# Patient Record
Sex: Male | Born: 1990
Health system: Southern US, Community
[De-identification: ages and names within clinical notes are randomized; demographics above are authoritative.]

## PROBLEM LIST (undated history)

## (undated) DIAGNOSIS — I10 Essential (primary) hypertension: Secondary | ICD-10-CM

## (undated) HISTORY — PX: NO PAST SURGERIES: SHX2092

## (undated) HISTORY — DX: Essential (primary) hypertension: I10

## (undated) HISTORY — PX: ANKLE SURGERY: SHX546

---
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
## 2012-02-21 ENCOUNTER — Ambulatory Visit (INDEPENDENT_AMBULATORY_CARE_PROVIDER_SITE_OTHER): Payer: 59 | Admitting: Internal Medicine

## 2012-02-21 ENCOUNTER — Encounter: Payer: Self-pay | Admitting: *Deleted

## 2012-02-21 ENCOUNTER — Other Ambulatory Visit: Payer: Self-pay | Admitting: Internal Medicine

## 2012-02-21 VITALS — BP 134/76 | HR 73 | Temp 98.4°F | Resp 18 | Ht 73.5 in | Wt 269.0 lb

## 2012-02-21 DIAGNOSIS — Z0000 Encounter for general adult medical examination without abnormal findings: Secondary | ICD-10-CM

## 2012-02-21 DIAGNOSIS — Z23 Encounter for immunization: Secondary | ICD-10-CM

## 2012-02-21 LAB — COMPREHENSIVE METABOLIC PANEL
ALT: 13 U/L (ref 0–53)
Albumin: 4.7 g/dL (ref 3.5–5.2)
CO2: 25 mEq/L (ref 19–32)
Glucose, Bld: 94 mg/dL (ref 70–99)
Potassium: 4.6 mEq/L (ref 3.5–5.3)
Sodium: 137 mEq/L (ref 135–145)
Total Bilirubin: 0.6 mg/dL (ref 0.3–1.2)
Total Protein: 7.6 g/dL (ref 6.0–8.3)

## 2012-02-21 LAB — POCT CBC
Granulocyte percent: 56.5 %G (ref 37–80)
HCT, POC: 43.7 % (ref 43.5–53.7)
Hemoglobin: 13.8 g/dL — AB (ref 14.1–18.1)
Lymph, poc: 2 (ref 0.6–3.4)
MCHC: 31.6 g/dL — AB (ref 31.8–35.4)
MPV: 9.4 fL (ref 0–99.8)
POC Granulocyte: 3.1 (ref 2–6.9)
POC MID %: 6.7 %M (ref 0–12)
RBC: 5.88 M/uL (ref 4.69–6.13)

## 2012-02-21 LAB — LIPID PANEL
Cholesterol: 127 mg/dL (ref 0–169)
LDL Cholesterol: 70 mg/dL (ref 0–109)
Triglycerides: 34 mg/dL (ref <150)
VLDL: 7 mg/dL (ref 0–40)

## 2012-02-21 MED ORDER — MENINGOCOCCAL A C Y&W-135 CONJ IM INJ
0.5000 mL | INJECTION | Freq: Once | INTRAMUSCULAR | Status: AC
  Administered 2012-02-21: 0.5 mL via INTRAMUSCULAR

## 2012-02-21 MED ORDER — TETANUS-DIPHTH-ACELL PERTUSSIS 5-2.5-18.5 LF-MCG/0.5 IM SUSP
0.5000 mL | Freq: Once | INTRAMUSCULAR | Status: AC
  Administered 2012-02-21: 0.5 mL via INTRAMUSCULAR

## 2012-02-21 NOTE — Progress Notes (Signed)
  Subjective:    Patient ID: Philip Irwin., male    DOB: [birth year], 21 y.o.   MRN: [id number]  HPIHere for annual exam and precollege checkup accepted at [employer] No history of medical problems  No risk behaviors Parents have no additional concerns    Review of Systems  Constitutional: Negative.   HENT: Negative.   Eyes: Negative.   Respiratory: Negative.   Cardiovascular: Negative.   Gastrointestinal: Negative.   Genitourinary: Negative.   Musculoskeletal: Negative.   Skin: Negative.   Neurological: Negative.   Hematological: Negative.   Psychiatric/Behavioral: Negative.        Objective:   Physical Exam  Constitutional: He is oriented to person, place, and time. He appears well-developed and well-nourished.       Elevated BMI  HENT:  Head: Normocephalic.  Right Ear: External ear normal.  Left Ear: External ear normal.  Nose: Nose normal.  Mouth/Throat: Oropharynx is clear and moist.  Eyes: Conjunctivae and EOM are normal. Pupils are equal, round, and reactive to light.  Neck: Normal range of motion. Neck supple.  Cardiovascular: Normal rate, regular rhythm, normal heart sounds and intact distal pulses.   Pulmonary/Chest: Effort normal and breath sounds normal.  Abdominal: Soft. Bowel sounds are normal. He exhibits no mass.  Genitourinary:       No testicular masses/testicular self-examination discussed  Musculoskeletal: Normal range of motion. He exhibits no edema.  Neurological: He is alert and oriented to person, place, and time. He has normal reflexes.  Skin: Skin is warm and dry.  Psychiatric: He has a normal mood and affect. His behavior is normal.      Results for orders placed in visit on 02/21/12  POCT CBC      Component Value Range   WBC 5.5  4.6 - 10.2 (K/uL)   Lymph, poc 2.0  0.6 - 3.4    POC LYMPH PERCENT 36.8  10 - 50 (%L)   MID (cbc) 0.4  0 - 0.9    POC MID % 6.7  0 - 12 (%M)   POC Granulocyte 3.1  2 - 6.9    Granulocyte  percent 56.5  37 - 80 (%G)   RBC 5.88  4.69 - 6.13 (M/uL)   Hemoglobin 13.8 (*) 14.1 - 18.1 (g/dL)   HCT, POC 40.9  81.1 - 53.7 (%)   MCV 74.4 (*) 80 - 97 (fL)   MCH, POC 23.5 (*) 27 - 31.2 (pg)   MCHC 31.6 (*) 31.8 - 35.4 (g/dL)   RDW, POC 91.4     Platelet Count, POC 364  142 - 424 (K/uL)   MPV 9.4  0 - 99.8 (fL)       Assessment & Plan:  Annual examination Tdap Menactra  Elevated BMI Exercise/calorie control Lipid profile LFTs

## 2012-02-24 LAB — IRON AND TIBC
%SAT: 20 % (ref 20–55)
TIBC: 353 ug/dL (ref 215–435)
UIBC: 283 ug/dL (ref 125–400)

## 2012-02-25 ENCOUNTER — Encounter: Payer: Self-pay | Admitting: Internal Medicine

## 2017-07-05 ENCOUNTER — Ambulatory Visit (INDEPENDENT_AMBULATORY_CARE_PROVIDER_SITE_OTHER): Payer: 59 | Admitting: Emergency Medicine

## 2017-07-05 ENCOUNTER — Encounter: Payer: Self-pay | Admitting: Emergency Medicine

## 2017-07-05 VITALS — BP 159/91 | HR 89 | Temp 98.6°F | Resp 18 | Ht 73.5 in | Wt 328.0 lb

## 2017-07-05 DIAGNOSIS — Z7689 Persons encountering health services in other specified circumstances: Secondary | ICD-10-CM | POA: Insufficient documentation

## 2017-07-05 DIAGNOSIS — Z0000 Encounter for general adult medical examination without abnormal findings: Secondary | ICD-10-CM | POA: Diagnosis not present

## 2017-07-05 NOTE — Progress Notes (Signed)
Philip Irwin. 26 y.o.   Chief Complaint  Patient presents with  . Annual Exam    HISTORY OF PRESENT ILLNESS: This is a 26 y.o. male here for annual exam; has no complaints or medical concerns.  HPI   Prior to Admission medications   Not on File    No Known Allergies  There are no active problems to display for this patient.   No past medical history on file.  No past surgical history on file.  Social History   Social History  . Marital status: Single    Spouse name: N/A  . Number of children: N/A  . Years of education: N/A   Occupational History  . Not on file.   Social History Main Topics  . Smoking status: Never Smoker  . Smokeless tobacco: Never Used  . Alcohol use Yes     Comment: 8-10   . Drug use: No  . Sexual activity: Not on file   Other Topics Concern  . Not on file   Social History Narrative  . No narrative on file    No family history on file.   Review of Systems  Constitutional: Negative.  Negative for chills, fever and malaise/fatigue.  HENT: Negative.  Negative for congestion, hearing loss, nosebleeds and sore throat.   Eyes: Negative.  Negative for blurred vision and double vision.  Respiratory: Negative for cough, hemoptysis, shortness of breath and wheezing.   Cardiovascular: Negative.  Negative for chest pain and palpitations.  Gastrointestinal: Negative.  Negative for abdominal pain, diarrhea, nausea and vomiting.  Genitourinary: Negative.  Negative for dysuria, frequency and hematuria.  Musculoskeletal: Negative.  Negative for back pain, joint pain, myalgias and neck pain.  Skin: Negative.  Negative for rash.  Neurological: Negative.  Negative for dizziness, sensory change, focal weakness and headaches.  Endo/Heme/Allergies: Negative.   All other systems reviewed and are negative.   Vitals:   07/05/17 0856  BP: (!) 159/91  Pulse: 89  Resp: 18  Temp: 98.6 F (37 C)  SpO2: 99%    Physical Exam    Constitutional: He is oriented to person, place, and time. He appears well-developed and well-nourished.  HENT:  Head: Normocephalic and atraumatic.  Right Ear: External ear normal.  Left Ear: External ear normal.  Nose: Nose normal.  Mouth/Throat: Oropharynx is clear and moist.  Eyes: Pupils are equal, round, and reactive to light. Conjunctivae and EOM are normal.  Neck: Normal range of motion. Neck supple. No JVD present.  Cardiovascular: Normal rate, regular rhythm, normal heart sounds and intact distal pulses.   Pulmonary/Chest: Effort normal and breath sounds normal.  Abdominal: Soft. Bowel sounds are normal. He exhibits no distension and no mass. There is no tenderness.  Musculoskeletal: Normal range of motion. He exhibits no edema or tenderness.  Lymphadenopathy:    He has no cervical adenopathy.  Neurological: He is alert and oriented to person, place, and time. No cranial nerve deficit or sensory deficit. He exhibits normal muscle tone. Coordination normal.  Skin: Skin is warm and dry. Capillary refill takes less than 2 seconds. No rash noted.  Psychiatric: He has a normal mood and affect. His behavior is normal.  Vitals reviewed.    ASSESSMENT & PLAN: Izaiyah was seen today for annual exam.  Diagnoses and all orders for this visit:  Routine general medical examination at a health care facility -     CBC with Differential -     Comprehensive metabolic panel -  Hemoglobin A1c -     Lipid panel -     TSH -     HIV antibody  Other orders -     Cancel: PSA -     Cancel: HIV antibody -     Cancel: RPR -     Cancel: Comprehensive metabolic panel -     Cancel: Lipid panel -     Cancel: CBC -     Cancel: POCT urinalysis dipstick    Patient Instructions       IF you received an x-ray today, you will receive an invoice from [facility]. Please contact [facility] at [phone] with questions or concerns regarding your invoice.   IF you  received labwork today, you will receive an invoice from [facility]. Please contact [facility] at [phone] with questions or concerns regarding your invoice.   Our billing staff will not be able to assist you with questions regarding bills from these companies.  You will be contacted with the lab results as soon as they are available. The fastest way to get your results is to activate your My Chart account. Instructions are located on the last page of this paperwork. If you have not heard from [place] regarding the results in 2 weeks, please contact this office.         Health Maintenance, Male A healthy lifestyle and preventive care is important for your health and wellness. Ask your health care provider about what schedule of regular examinations is right for you. What should I know about weight and diet? Eat a Healthy Diet  Eat plenty of vegetables, fruits, whole grains, low-fat dairy products, and lean protein.  Do not eat a lot of foods high in solid fats, added sugars, or salt.  Maintain a Healthy Weight Regular exercise can help you achieve or maintain a healthy weight. You should:  Do at least 150 minutes of exercise each week. The exercise should increase your heart rate and make you sweat (moderate-intensity exercise).  Do strength-training exercises at least twice a week.  Watch Your Levels of Cholesterol and Blood Lipids  Have your blood tested for lipids and cholesterol every 5 years starting at 24 years of age. If you are at high risk for heart disease, you should start having your blood tested when you are 24 years old. You may need to have your cholesterol levels checked more often if: ? Your lipid or cholesterol levels are high. ? You are older than 24 years of age. ? You are at high risk for heart disease.  What should I know about cancer screening? Many types of cancers can be detected early and may often be prevented. Lung Cancer  You should be screened every  year for lung cancer if: ? You are a current smoker who has smoked for at least 30 years. ? You are a former smoker who has quit within the past 15 years.  Talk to your health care provider about your screening options, when you should start screening, and how often you should be screened.  Colorectal Cancer  Routine colorectal cancer screening usually begins at 24 years of age and should be repeated every 5-10 years until you are 24 years old. You may need to be screened more often if early forms of precancerous polyps or small growths are found. Your health care provider may recommend screening at an earlier age if you have risk factors for colon cancer.  Your health care provider may recommend using home test  kits to check for hidden blood in the stool.  A small camera at the end of a tube can be used to examine your colon (sigmoidoscopy or colonoscopy). This checks for the earliest forms of colorectal cancer.  Prostate and Testicular Cancer  Depending on your age and overall health, your health care provider may do certain tests to screen for prostate and testicular cancer.  Talk to your health care provider about any symptoms or concerns you have about testicular or prostate cancer.  Skin Cancer  Check your skin from head to toe regularly.  Tell your health care provider about any new moles or changes in moles, especially if: ? There is a change in a mole's size, shape, or color. ? You have a mole that is larger than a pencil eraser.  Always use sunscreen. Apply sunscreen liberally and repeat throughout the day.  Protect yourself by wearing long sleeves, pants, a wide-brimmed hat, and sunglasses when outside.  What should I know about heart disease, diabetes, and high blood pressure?  If you are 76-25 years of age, have your blood pressure checked every 3-5 years. If you are 73 years of age or older, have your blood pressure checked every year. You should have your blood  pressure measured twice-once when you are at a hospital or clinic, and once when you are not at a hospital or clinic. Record the average of the two measurements. To check your blood pressure when you are not at a hospital or clinic, you can use: ? An automated blood pressure machine at a pharmacy. ? A home blood pressure monitor.  Talk to your health care provider about your target blood pressure.  If you are between 29-12 years old, ask your health care provider if you should take aspirin to prevent heart disease.  Have regular diabetes screenings by checking your fasting blood sugar level. ? If you are at a normal weight and have a low risk for diabetes, have this test once every three years after the age of 39. ? If you are overweight and have a high risk for diabetes, consider being tested at a younger age or more often.  A one-time screening for abdominal aortic aneurysm (AAA) by ultrasound is recommended for men aged 65-75 years who are current or former smokers. What should I know about preventing infection? Hepatitis B If you have a higher risk for hepatitis B, you should be screened for this virus. Talk with your health care provider to find out if you are at risk for hepatitis B infection. Hepatitis C Blood testing is recommended for:  Everyone born from [birth year] through 1963.  Anyone with known risk factors for hepatitis C.  Sexually Transmitted Diseases (STDs)  You should be screened each year for STDs including gonorrhea and chlamydia if: ? You are sexually active and are younger than 24 years of age. ? You are older than 24 years of age and your health care provider tells you that you are at risk for this type of infection. ? Your sexual activity has changed since you were last screened and you are at an increased risk for chlamydia or gonorrhea. Ask your health care provider if you are at risk.  Talk with your health care provider about whether you are at high risk of being  infected with HIV. Your health care provider may recommend a prescription medicine to help prevent HIV infection.  What else can I do?  Schedule regular health, dental, and eye exams.  Stay current with your vaccines (immunizations).  Do not use any tobacco products, such as cigarettes, chewing tobacco, and e-cigarettes. If you need help quitting, ask your health care provider.  Limit alcohol intake to no more than 2 drinks per day. One drink equals 12 ounces of beer, 5 ounces of wine, or 1 ounces of hard liquor.  Do not use street drugs.  Do not share needles.  Ask your health care provider for help if you need support or information about quitting drugs.  Tell your health care provider if you often feel depressed.  Tell your health care provider if you have ever been abused or do not feel safe at home. This information is not intended to replace advice given to you by your health care provider. Make sure you discuss any questions you have with your health care provider. Document Released: 04/08/2008 Document Revised: 06/09/2016 Document Reviewed: 07/15/2015 Elsevier Interactive Patient Education  2018 [employer].   Fat and Cholesterol Restricted Diet Getting too much fat and cholesterol in your diet may cause health problems. Following this diet helps keep your fat and cholesterol at normal levels. This can keep you from getting sick. What types of fat should I choose?  Choose monosaturated and polyunsaturated fats. These are found in foods such as olive oil, canola oil, flaxseeds, walnuts, almonds, and seeds.  Eat more omega-3 fats. Good choices include salmon, mackerel, sardines, tuna, flaxseed oil, and ground flaxseeds.  Limit saturated fats. These are in animal products such as meats, butter, and cream. They can also be in plant products such as palm oil, palm kernel oil, and coconut oil.  Avoid foods with partially hydrogenated oils in them. These contain trans fats.  Examples of foods that have trans fats are stick margarine, some tub margarines, cookies, crackers, and other baked goods. What general guidelines do I need to follow?  Check food labels. Look for the words "trans fat" and "saturated fat."  When preparing a meal: ? Fill half of your plate with vegetables and green salads. ? Fill one fourth of your plate with whole grains. Look for the word "whole" as the first word in the ingredient list. ? Fill one fourth of your plate with lean protein foods.  Eat more foods that have fiber, like apples, carrots, beans, peas, and barley.  Eat more home-cooked foods. Eat less at restaurants and buffets.  Limit or avoid alcohol.  Limit foods high in starch and sugar.  Limit fried foods.  Cook foods without frying them. Baking, boiling, grilling, and broiling are all great options.  Lose weight if you are overweight. Losing even a small amount of weight can help your overall health. It can also help prevent diseases such as diabetes and heart disease. What foods can I eat? Grains Whole grains, such as whole wheat or whole grain breads, crackers, cereals, and pasta. Unsweetened oatmeal, bulgur, barley, quinoa, or brown rice. Corn or whole wheat flour tortillas. Vegetables Fresh or frozen vegetables (raw, steamed, roasted, or grilled). Green salads. Fruits All fresh, canned (in natural juice), or frozen fruits. Meat and Other Protein Products Ground beef (85% or leaner), grass-fed beef, or beef trimmed of fat. Skinless chicken or [place]. Ground chicken or [place]. Pork trimmed of fat. All fish and seafood. Eggs. Dried beans, peas, or lentils. Unsalted nuts or seeds. Unsalted canned or dry beans. Dairy Low-fat dairy products, such as skim or 1% milk, 2% or reduced-fat cheeses, low-fat ricotta or cottage cheese, or plain low-fat yogurt. Fats  and Oils Tub margarines without trans fats. Light or reduced-fat mayonnaise and salad dressings. Avocado. Olive,  canola, sesame, or safflower oils. Natural peanut or almond butter (choose ones without added sugar and oil). The items listed above may not be a complete list of recommended foods or beverages. Contact your dietitian for more options. What foods are not recommended? Grains White bread. White pasta. White rice. Cornbread. Bagels, pastries, and croissants. Crackers that contain trans fat. Vegetables White potatoes. Corn. Creamed or fried vegetables. Vegetables in a cheese sauce. Fruits Dried fruits. Canned fruit in light or heavy syrup. Fruit juice. Meat and Other Protein Products Fatty cuts of meat. Ribs, chicken wings, bacon, sausage, bologna, salami, chitterlings, fatback, hot dogs, bratwurst, and packaged luncheon meats. Liver and organ meats. Dairy Whole or 2% milk, cream, half-and-half, and cream cheese. Whole milk cheeses. Whole-fat or sweetened yogurt. Full-fat cheeses. Nondairy creamers and whipped toppings. Processed cheese, cheese spreads, or cheese curds. Sweets and Desserts Corn syrup, sugars, honey, and molasses. Candy. Jam and jelly. Syrup. Sweetened cereals. Cookies, pies, cakes, donuts, muffins, and ice cream. Fats and Oils Butter, stick margarine, lard, shortening, ghee, or bacon fat. Coconut, palm kernel, or palm oils. Beverages Alcohol. Sweetened drinks (such as sodas, lemonade, and fruit drinks or punches). The items listed above may not be a complete list of foods and beverages to avoid. Contact your dietitian for more information. This information is not intended to replace advice given to you by your health care provider. Make sure you discuss any questions you have with your health care provider. Document Released: 04/11/2012 Document Revised: 06/17/2016 Document Reviewed: 01/10/2014 Elsevier Interactive Patient Education  2018 [employer]  American Heart Association (AHA) Exercise Recommendation  Being physically active is important to prevent heart disease and  stroke, the nation's No. 1and No. 5killers. To improve overall cardiovascular health, we suggest at least 150 minutes per week of moderate exercise or 75 minutes per week of vigorous exercise (or a combination of moderate and vigorous activity). Thirty minutes a day, five times a week is an easy goal to remember. You will also experience benefits even if you divide your time into two or three segments of 10 to 15 minutes per day.  For people who would benefit from lowering their blood pressure or cholesterol, we recommend 40 minutes of aerobic exercise of moderate to vigorous intensity three to four times a week to lower the risk for heart attack and stroke.  Physical activity is anything that makes you move your body and burn calories.  This includes things like climbing stairs or playing sports. Aerobic exercises benefit your heart, and include walking, jogging, swimming or biking. Strength and stretching exercises are best for overall stamina and flexibility.  The simplest, positive change you can make to effectively improve your heart health is to start walking. It's enjoyable, free, easy, social and great exercise. A walking program is flexible and boasts high success rates because people can stick with it. It's easy for walking to become a regular and satisfying part of life.   For Overall Cardiovascular Health:  At least 30 minutes of moderate-intensity aerobic activity at least 5 days per week for a total of 150  OR   At least 25 minutes of vigorous aerobic activity at least 3 days per week for a total of 75 minutes; or a combination of moderate- and vigorous-intensity aerobic activity  AND   Moderate- to high-intensity muscle-strengthening activity at least 2 days per week for additional health benefits.  For Lowering Blood Pressure and Cholesterol  An average 40 minutes of moderate- to vigorous-intensity aerobic activity 3 or 4 times per week  What if I can't make it to the time  goal? Something is always better than nothing! And everyone has to start somewhere. Even if you've been sedentary for years, today is the day you can begin to make healthy changes in your life. If you don't think you'll make it for 30 or 40 minutes, set a reachable goal for today. You can work up toward your overall goal by increasing your time as you get stronger. Don't let all-or-nothing thinking rob you of doing what you can every day.  Source:[email or web address], MD [facility]

## 2017-07-05 NOTE — Patient Instructions (Addendum)
IF you received an x-ray today, you will receive an invoice from [facility]. Please contact [facility] at [phone] with questions or concerns regarding your invoice.   IF you received labwork today, you will receive an invoice from [facility]. Please contact [facility] at [phone] with questions or concerns regarding your invoice.   Our billing staff will not be able to assist you with questions regarding bills from these companies.  You will be contacted with the lab results as soon as they are available. The fastest way to get your results is to activate your My Chart account. Instructions are located on the last page of this paperwork. If you have not heard from [place] regarding the results in 2 weeks, please contact this office.         Health Maintenance, Male A healthy lifestyle and preventive care is important for your health and wellness. Ask your health care provider about what schedule of regular examinations is right for you. What should I know about weight and diet? Eat a Healthy Diet  Eat plenty of vegetables, fruits, whole grains, low-fat dairy products, and lean protein.  Do not eat a lot of foods high in solid fats, added sugars, or salt.  Maintain a Healthy Weight Regular exercise can help you achieve or maintain a healthy weight. You should:  Do at least 150 minutes of exercise each week. The exercise should increase your heart rate and make you sweat (moderate-intensity exercise).  Do strength-training exercises at least twice a week.  Watch Your Levels of Cholesterol and Blood Lipids  Have your blood tested for lipids and cholesterol every 5 years starting at 24 years of age. If you are at high risk for heart disease, you should start having your blood tested when you are 24 years old. You may need to have your cholesterol levels checked more often if: ? Your lipid or cholesterol levels are high. ? You are older than 24 years of  age. ? You are at high risk for heart disease.  What should I know about cancer screening? Many types of cancers can be detected early and may often be prevented. Lung Cancer  You should be screened every year for lung cancer if: ? You are a current smoker who has smoked for at least 30 years. ? You are a former smoker who has quit within the past 15 years.  Talk to your health care provider about your screening options, when you should start screening, and how often you should be screened.  Colorectal Cancer  Routine colorectal cancer screening usually begins at 24 years of age and should be repeated every 5-10 years until you are 24 years old. You may need to be screened more often if early forms of precancerous polyps or small growths are found. Your health care provider may recommend screening at an earlier age if you have risk factors for colon cancer.  Your health care provider may recommend using home test kits to check for hidden blood in the stool.  A small camera at the end of a tube can be used to examine your colon (sigmoidoscopy or colonoscopy). This checks for the earliest forms of colorectal cancer.  Prostate and Testicular Cancer  Depending on your age and overall health, your health care provider may do certain tests to screen for prostate and testicular cancer.  Talk to your health care provider about any symptoms or concerns you have about testicular or prostate cancer.  Skin Cancer  from head to toe regularly.  Tell your health care provider about any new moles or changes in moles, especially if: ? There is a change in a mole's size, shape, or color. ? You have a mole that is larger than a pencil eraser.  Always use sunscreen. Apply sunscreen liberally and repeat throughout the day.  Protect yourself by wearing long sleeves, pants, a wide-brimmed hat, and sunglasses when outside.  What should I know about heart disease, diabetes, and high blood  pressure?  If you are 18-39 years of age, have your blood pressure checked every 3-5 years. If you are 40 years of age or older, have your blood pressure checked every year. You should have your blood pressure measured twice-once when you are at a hospital or clinic, and once when you are not at a hospital or clinic. Record the average of the two measurements. To check your blood pressure when you are not at a hospital or clinic, you can use: ? An automated blood pressure machine at a pharmacy. ? A home blood pressure monitor.  Talk to your health care provider about your target blood pressure.  If you are between 45-79 years old, ask your health care provider if you should take aspirin to prevent heart disease.  Have regular diabetes screenings by checking your fasting blood sugar level. ? If you are at a normal weight and have a low risk for diabetes, have this test once every three years after the age of 45. ? If you are overweight and have a high risk for diabetes, consider being tested at a younger age or more often.  A one-time screening for abdominal aortic aneurysm (AAA) by ultrasound is recommended for men aged 65-75 years who are current or former smokers. What should I know about preventing infection? Hepatitis B If you have a higher risk for hepatitis B, you should be screened for this virus. Talk with your health care provider to find out if you are at risk for hepatitis B infection. Hepatitis C Blood testing is recommended for:  Everyone born from 1945 through 1965.  Anyone with known risk factors for hepatitis C.  Sexually Transmitted Diseases (STDs)  You should be screened each year for STDs including gonorrhea and chlamydia if: ? You are sexually active and are younger than 24 years of age. ? You are older than 24 years of age and your health care provider tells you that you are at risk for this type of infection. ? Your sexual activity has changed since you were last  screened and you are at an increased risk for chlamydia or gonorrhea. Ask your health care provider if you are at risk.  Talk with your health care provider about whether you are at high risk of being infected with HIV. Your health care provider may recommend a prescription medicine to help prevent HIV infection.  What else can I do?  Schedule regular health, dental, and eye exams.  Stay current with your vaccines (immunizations).  Do not use any tobacco products, such as cigarettes, chewing tobacco, and e-cigarettes. If you need help quitting, ask your health care provider.  Limit alcohol intake to no more than 2 drinks per day. One drink equals 12 ounces of beer, 5 ounces of wine, or 1 ounces of hard liquor.  Do not use street drugs.  Do not share needles.  Ask your health care provider for help if you need support or information about quitting drugs.  Tell your health care   your health care provider if you often feel depressed.  Tell your health care provider if you have ever been abused or do not feel safe at home. This information is not intended to replace advice given to you by your health care provider. Make sure you discuss any questions you have with your health care provider. Document Released: 04/08/2008 Document Revised: 06/09/2016 Document Reviewed: 07/15/2015 Elsevier Interactive Patient Education  2018 [employer].   Fat and Cholesterol Restricted Diet Getting too much fat and cholesterol in your diet may cause health problems. Following this diet helps keep your fat and cholesterol at normal levels. This can keep you from getting sick. What types of fat should I choose?  Choose monosaturated and polyunsaturated fats. These are found in foods such as olive oil, canola oil, flaxseeds, walnuts, almonds, and seeds.  Eat more omega-3 fats. Good choices include salmon, mackerel, sardines, tuna, flaxseed oil, and ground flaxseeds.  Limit saturated fats. These are in animal  products such as meats, butter, and cream. They can also be in plant products such as palm oil, palm kernel oil, and coconut oil.  Avoid foods with partially hydrogenated oils in them. These contain trans fats. Examples of foods that have trans fats are stick margarine, some tub margarines, cookies, crackers, and other baked goods. What general guidelines do I need to follow?  Check food labels. Look for the words "trans fat" and "saturated fat."  When preparing a meal: ? Fill half of your plate with vegetables and green salads. ? Fill one fourth of your plate with whole grains. Look for the word "whole" as the first word in the ingredient list. ? Fill one fourth of your plate with lean protein foods.  Eat more foods that have fiber, like apples, carrots, beans, peas, and barley.  Eat more home-cooked foods. Eat less at restaurants and buffets.  Limit or avoid alcohol.  Limit foods high in starch and sugar.  Limit fried foods.  Cook foods without frying them. Baking, boiling, grilling, and broiling are all great options.  Lose weight if you are overweight. Losing even a small amount of weight can help your overall health. It can also help prevent diseases such as diabetes and heart disease. What foods can I eat? Grains Whole grains, such as whole wheat or whole grain breads, crackers, cereals, and pasta. Unsweetened oatmeal, bulgur, barley, quinoa, or brown rice. Corn or whole wheat flour tortillas. Vegetables Fresh or frozen vegetables (raw, steamed, roasted, or grilled). Green salads. Fruits All fresh, canned (in natural juice), or frozen fruits. Meat and Other Protein Products Ground beef (85% or leaner), grass-fed beef, or beef trimmed of fat. Skinless chicken or [place]. Ground chicken or [place]. Pork trimmed of fat. All fish and seafood. Eggs. Dried beans, peas, or lentils. Unsalted nuts or seeds. Unsalted canned or dry beans. Dairy Low-fat dairy products, such as skim or 1%  milk, 2% or reduced-fat cheeses, low-fat ricotta or cottage cheese, or plain low-fat yogurt. Fats and Oils Tub margarines without trans fats. Light or reduced-fat mayonnaise and salad dressings. Avocado. Olive, canola, sesame, or safflower oils. Natural peanut or almond butter (choose ones without added sugar and oil). The items listed above may not be a complete list of recommended foods or beverages. Contact your dietitian for more options. What foods are not recommended? Grains White bread. White pasta. White rice. Cornbread. Bagels, pastries, and croissants. Crackers that contain trans fat. Vegetables White potatoes. Corn. Creamed or fried vegetables. Vegetables in a cheese  sauce. Fruits Dried fruits. Canned fruit in light or heavy syrup. Fruit juice. Meat and Other Protein Products Fatty cuts of meat. Ribs, chicken wings, bacon, sausage, bologna, salami, chitterlings, fatback, hot dogs, bratwurst, and packaged luncheon meats. Liver and organ meats. Dairy Whole or 2% milk, cream, half-and-half, and cream cheese. Whole milk cheeses. Whole-fat or sweetened yogurt. Full-fat cheeses. Nondairy creamers and whipped toppings. Processed cheese, cheese spreads, or cheese curds. Sweets and Desserts Corn syrup, sugars, honey, and molasses. Candy. Jam and jelly. Syrup. Sweetened cereals. Cookies, pies, cakes, donuts, muffins, and ice cream. Fats and Oils Butter, stick margarine, lard, shortening, ghee, or bacon fat. Coconut, palm kernel, or palm oils. Beverages Alcohol. Sweetened drinks (such as sodas, lemonade, and fruit drinks or punches). The items listed above may not be a complete list of foods and beverages to avoid. Contact your dietitian for more information. This information is not intended to replace advice given to you by your health care provider. Make sure you discuss any questions you have with your health care provider. Document Released: 04/11/2012 Document Revised: 06/17/2016 Document  Reviewed: 01/10/2014 Elsevier Interactive Patient Education  2018 [employer]  American Heart Association (AHA) Exercise Recommendation  Being physically active is important to prevent heart disease and stroke, the nation's No. 1and No. 5killers. To improve overall cardiovascular health, we suggest at least 150 minutes per week of moderate exercise or 75 minutes per week of vigorous exercise (or a combination of moderate and vigorous activity). Thirty minutes a day, five times a week is an easy goal to remember. You will also experience benefits even if you divide your time into two or three segments of 10 to 15 minutes per day.  For people who would benefit from lowering their blood pressure or cholesterol, we recommend 40 minutes of aerobic exercise of moderate to vigorous intensity three to four times a week to lower the risk for heart attack and stroke.  Physical activity is anything that makes you move your body and burn calories.  This includes things like climbing stairs or playing sports. Aerobic exercises benefit your heart, and include walking, jogging, swimming or biking. Strength and stretching exercises are best for overall stamina and flexibility.  The simplest, positive change you can make to effectively improve your heart health is to start walking. It's enjoyable, free, easy, social and great exercise. A walking program is flexible and boasts high success rates because people can stick with it. It's easy for walking to become a regular and satisfying part of life.   For Overall Cardiovascular Health:  At least 30 minutes of moderate-intensity aerobic activity at least 5 days per week for a total of 150  OR   At least 25 minutes of vigorous aerobic activity at least 3 days per week for a total of 75 minutes; or a combination of moderate- and vigorous-intensity aerobic activity  AND   Moderate- to high-intensity muscle-strengthening activity at least 2 days per week for  additional health benefits.  For Lowering Blood Pressure and Cholesterol  An average 40 minutes of moderate- to vigorous-intensity aerobic activity 3 or 4 times per week  What if I can't make it to the time goal? Something is always better than nothing! And everyone has to start somewhere. Even if you've been sedentary for years, today is the day you can begin to make healthy changes in your life. If you don't think you'll make it for 30 or 40 minutes, set a reachable goal for today. You can  work up toward your overall goal by increasing your time as you get stronger. Don't let all-or-nothing thinking rob you of doing what you can every day.  Source:http://www.heart.org

## 2017-07-06 ENCOUNTER — Encounter: Payer: Self-pay | Admitting: Radiology

## 2017-07-06 LAB — COMPREHENSIVE METABOLIC PANEL
ALK PHOS: 51 IU/L (ref 39–117)
ALT: 15 IU/L (ref 0–44)
AST: 15 IU/L (ref 0–40)
Albumin/Globulin Ratio: 1.5 (ref 1.2–2.2)
Albumin: 4.5 g/dL (ref 3.5–5.5)
BILIRUBIN TOTAL: 0.5 mg/dL (ref 0.0–1.2)
BUN/Creatinine Ratio: 10 (ref 9–20)
BUN: 12 mg/dL (ref 6–20)
CHLORIDE: 100 mmol/L (ref 96–106)
CO2: 22 mmol/L (ref 20–29)
Calcium: 9.1 mg/dL (ref 8.7–10.2)
Creatinine, Ser: 1.16 mg/dL (ref 0.76–1.27)
GFR calc Af Amer: 102 mL/min/{1.73_m2} (ref >59)
GFR calc non Af Amer: 88 mL/min/{1.73_m2} (ref >59)
Globulin, Total: 3 g/dL (ref 1.5–4.5)
Glucose: 99 mg/dL (ref 65–99)
Potassium: 4.7 mmol/L (ref 3.5–5.2)
Sodium: 137 mmol/L (ref 134–144)
Total Protein: 7.5 g/dL (ref 6.0–8.5)

## 2017-07-06 LAB — LIPID PANEL
CHOLESTEROL TOTAL: 115 mg/dL (ref 100–199)
Chol/HDL Ratio: 2.4 ratio (ref 0.0–5.0)
HDL: 47 mg/dL (ref >39)
LDL Calculated: 59 mg/dL (ref 0–99)
TRIGLYCERIDES: 46 mg/dL (ref 0–149)
VLDL Cholesterol Cal: 9 mg/dL (ref 5–40)

## 2017-07-06 LAB — CBC WITH DIFFERENTIAL/PLATELET
BASOS ABS: 0 10*3/uL (ref 0.0–0.2)
Basos: 0 %
EOS (ABSOLUTE): 0.3 10*3/uL (ref 0.0–0.4)
Eos: 4 %
Hematocrit: 42.4 % (ref 37.5–51.0)
Hemoglobin: 13.4 g/dL (ref 13.0–17.7)
IMMATURE GRANS (ABS): 0 10*3/uL (ref 0.0–0.1)
Immature Granulocytes: 0 %
LYMPHS ABS: 1.7 10*3/uL (ref 0.7–3.1)
LYMPHS: 28 %
MCH: 23.1 pg — AB (ref 26.6–33.0)
MCHC: 31.6 g/dL (ref 31.5–35.7)
MCV: 73 fL — ABNORMAL LOW (ref 79–97)
Monocytes Absolute: 0.6 10*3/uL (ref 0.1–0.9)
Monocytes: 10 %
NEUTROS ABS: 3.5 10*3/uL (ref 1.4–7.0)
Neutrophils: 58 %
PLATELETS: 375 10*3/uL (ref 150–379)
RBC: 5.8 x10E6/uL (ref 4.14–5.80)
RDW: 15.4 % (ref 12.3–15.4)
WBC: 6 10*3/uL (ref 3.4–10.8)

## 2017-07-06 LAB — HEMOGLOBIN A1C
Est. average glucose Bld gHb Est-mCnc: 128 mg/dL
HEMOGLOBIN A1C: 6.1 % — AB (ref 4.8–5.6)

## 2017-07-06 LAB — TSH: TSH: 1.57 u[IU]/mL (ref 0.450–4.500)

## 2017-07-06 LAB — HIV ANTIBODY (ROUTINE TESTING W REFLEX): HIV SCREEN 4TH GENERATION: NONREACTIVE

## 2019-01-10 ENCOUNTER — Ambulatory Visit (HOSPITAL_COMMUNITY)
Admission: EM | Admit: 2019-01-10 | Discharge: 2019-01-10 | Disposition: A | Discharge status: Home / Self Care | Payer: 59 | Attending: Family Medicine | Admitting: Family Medicine

## 2019-01-10 ENCOUNTER — Encounter (HOSPITAL_COMMUNITY): Payer: Self-pay | Admitting: Emergency Medicine

## 2019-01-10 ENCOUNTER — Other Ambulatory Visit: Payer: Self-pay

## 2019-01-10 DIAGNOSIS — B349 Viral infection, unspecified: Secondary | ICD-10-CM

## 2019-01-10 MED ORDER — FLUTICASONE PROPIONATE 50 MCG/ACT NA SUSP: 2.0000 | Freq: Every day | NASAL | 0 refills | Status: DC

## 2019-01-10 MED ORDER — BENZONATATE 100 MG PO CAPS: 100.0000 mg | ORAL_CAPSULE | Freq: Three times a day (TID) | ORAL | 0 refills | Status: DC

## 2019-01-10 MED ORDER — IPRATROPIUM BROMIDE 0.06 % NA SOLN: 2.0000 | Freq: Four times a day (QID) | NASAL | 0 refills | Status: DC

## 2019-01-10 NOTE — Discharge Instructions (Signed)
Tessalon for cough. Start flonase, atrovent nasal spray for nasal congestion/drainage. You can use over the counter nasal saline rinse such as neti pot for nasal congestion. Keep hydrated, your urine should be clear to pale yellow in color. Tylenol/motrin for fever and pain. Monitor for any worsening of symptoms, chest pain, shortness of breath, wheezing, swelling of the throat, follow up for reevaluation.  ° °For sore throat/cough try using a honey-based tea. Use 3 teaspoons of honey with juice squeezed from half lemon. Place shaved pieces of ginger into 1/2-1 cup of water and warm over stove top. Then mix the ingredients and repeat every 4 hours as needed. °

## 2019-01-10 NOTE — ED Provider Notes (Signed)
[facility]    CSN: [id number] Arrival date & time: 01/10/19  1455     History   Chief Complaint No chief complaint on file.   HPI Ketih Cordill. is a 28 y.o. male.   28 year old male comes in for 4-5 days of URI symptoms. Has had cough, nasal congestion, rhinorrhea. States now with left chest and shoulder pain with sneezing and cough. Denies shortness of breath, wheezing. Denies fever, chills, night sweats. Denies travels. Never smoker. No obvious sick contact. otc cold medicine without relief. Has not had any antipyretic in the past 8hours. States had a coughing fit at work, and was told to come in for evaluation.      History reviewed. No pertinent past medical history.  Patient Active Problem List   Diagnosis Date Noted  . Routine general medical examination at a health care facility 07/05/2017    History reviewed. No pertinent surgical history.     Home Medications    Prior to Admission medications   Medication Sig Start Date End Date Taking? Authorizing Provider  benzonatate (TESSALON) 100 MG capsule Take 1 capsule (100 mg total) by mouth every 8 (eight) hours. 01/10/19   Cathie Hoops, Naheem Mosco V, PA-C  fluticasone (FLONASE) 50 MCG/ACT nasal spray Place 2 sprays into both nostrils daily. 01/10/19   Cathie Hoops, Oree Mirelez V, PA-C  ipratropium (ATROVENT) 0.06 % nasal spray Place 2 sprays into both nostrils 4 (four) times daily. 01/10/19   Belinda Fisher, PA-C    Family History No family history on file.  Social History Social History   Tobacco Use  . Smoking status: Never Smoker  . Smokeless tobacco: Never Used  Substance Use Topics  . Alcohol use: Yes    Comment: 8-10   . Drug use: No     Allergies   Patient has no known allergies.   Review of Systems Review of Systems  Reason unable to perform ROS: See HPI as above.     Physical Exam Triage Vital Signs ED Triage Vitals  Enc Vitals Group     BP      Pulse      Resp      Temp      Temp src      SpO2       Weight      Height      Head Circumference      Peak Flow      Pain Score      Pain Loc      Pain Edu?      Excl. in GC?    No data found.  Updated Vital Signs BP (!) 164/107   Pulse 94   Temp 98.4 F (36.9 C)   Resp 18   SpO2 98%   Physical Exam Constitutional:      General: He is not in acute distress.    Appearance: He is well-developed. He is not ill-appearing, toxic-appearing or diaphoretic.  HENT:     Head: Normocephalic and atraumatic.     Right Ear: Tympanic membrane, ear canal and external ear normal. Tympanic membrane is not erythematous or bulging.     Left Ear: Tympanic membrane, ear canal and external ear normal. Tympanic membrane is not erythematous or bulging.     Nose: Nose normal.     Right Sinus: No maxillary sinus tenderness or frontal sinus tenderness.     Left Sinus: No maxillary sinus tenderness or frontal sinus tenderness.  Mouth/Throat:     Mouth: Mucous membranes are moist.     Pharynx: Oropharynx is clear. Uvula midline.  Eyes:     Conjunctiva/sclera: Conjunctivae normal.     Pupils: Pupils are equal, round, and reactive to light.  Neck:     Musculoskeletal: Normal range of motion and neck supple.  Cardiovascular:     Rate and Rhythm: Normal rate and regular rhythm.     Heart sounds: Normal heart sounds. No murmur. No friction rub. No gallop.   Pulmonary:     Effort: Pulmonary effort is normal. No accessory muscle usage, prolonged expiration, respiratory distress or retractions.     Breath sounds: Normal breath sounds. No stridor, decreased air movement or transmitted upper airway sounds. No decreased breath sounds, wheezing, rhonchi or rales.  Chest:     Chest wall: Tenderness (left chest and shoulder) present.  Skin:    General: Skin is warm and dry.  Neurological:     Mental Status: He is alert and oriented to person, place, and time.      UC Treatments / Results  Labs (all labs ordered are listed, but only abnormal results  are displayed) Labs Reviewed - No data to display  EKG None  Radiology No results found.  Procedures Procedures (including critical care time)  Medications Ordered in UC Medications - No data to display  Initial Impression / Assessment and Plan / UC Course  I have reviewed the triage vital signs and the nursing notes.  Pertinent labs & imaging results that were available during my care of the patient were reviewed by me and considered in my medical decision making (see chart for details).    Discussed with patient history and exam most consistent with viral URI. History and exam with low risk of COVID at this time. Symptomatic treatment as needed. Push fluids. Return precautions given.   Final Clinical Impressions(s) / UC Diagnoses   Final diagnoses:  Viral illness   ED Prescriptions    Medication Sig Dispense Auth. Provider   benzonatate (TESSALON) 100 MG capsule Take 1 capsule (100 mg total) by mouth every 8 (eight) hours. 21 capsule Panhia Karl V, PA-C   ipratropium (ATROVENT) 0.06 % nasal spray Place 2 sprays into both nostrils 4 (four) times daily. 15 mL Yaphet Smethurst V, PA-C   fluticasone (FLONASE) 50 MCG/ACT nasal spray Place 2 sprays into both nostrils daily. 1 g Threasa Alpha, [place] 01/10/19 1549

## 2019-01-10 NOTE — ED Triage Notes (Signed)
Triaged by provider  

## 2021-05-20 ENCOUNTER — Encounter (HOSPITAL_COMMUNITY): Payer: Self-pay

## 2021-05-20 ENCOUNTER — Ambulatory Visit (HOSPITAL_COMMUNITY)
Admission: RE | Admit: 2021-05-20 | Discharge: 2021-05-20 | Disposition: A | Discharge status: Home / Self Care | Payer: 59 | Source: Ambulatory Visit | Attending: Emergency Medicine | Admitting: Emergency Medicine

## 2021-05-20 ENCOUNTER — Other Ambulatory Visit: Payer: Self-pay

## 2021-05-20 VITALS — BP 196/128 | HR 103 | Temp 98.7°F | Resp 20

## 2021-05-20 DIAGNOSIS — I10 Essential (primary) hypertension: Secondary | ICD-10-CM

## 2021-05-20 DIAGNOSIS — R059 Cough, unspecified: Secondary | ICD-10-CM

## 2021-05-20 LAB — BASIC METABOLIC PANEL
Anion gap: 8 (ref 5–15)
BUN: 9 mg/dL (ref 6–20)
CO2: 24 mmol/L (ref 22–32)
Calcium: 9.3 mg/dL (ref 8.9–10.3)
Chloride: 105 mmol/L (ref 98–111)
Creatinine, Ser: 0.98 mg/dL (ref 0.61–1.24)
GFR, Estimated: >60 mL/min (ref >60)
Glucose, Bld: 89 mg/dL (ref 70–99)
Potassium: 3.8 mmol/L (ref 3.5–5.1)
Sodium: 137 mmol/L (ref 135–145)

## 2021-05-20 LAB — CBC
HCT: 48.1 % (ref 39.0–52.0)
Hemoglobin: 15.5 g/dL (ref 13.0–17.0)
MCH: 25.1 pg — ABNORMAL LOW (ref 26.0–34.0)
MCHC: 32.2 g/dL (ref 30.0–36.0)
MCV: 77.8 fL — ABNORMAL LOW (ref 80.0–100.0)
Platelets: 428 10*3/uL — ABNORMAL HIGH (ref 150–400)
RBC: 6.18 MIL/uL — ABNORMAL HIGH (ref 4.22–5.81)
RDW: 14.7 % (ref 11.5–15.5)
WBC: 10.4 10*3/uL (ref 4.0–10.5)
nRBC: 0 % (ref 0.0–0.2)

## 2021-05-20 MED ORDER — BENZONATATE 100 MG PO CAPS: 100.0000 mg | ORAL_CAPSULE | Freq: Three times a day (TID) | ORAL | 0 refills | Status: DC

## 2021-05-20 MED ORDER — AMLODIPINE BESYLATE 10 MG PO TABS: 10.0000 mg | ORAL_TABLET | Freq: Every day | ORAL | 1 refills | Status: DC

## 2021-05-20 NOTE — Discharge Instructions (Addendum)
For cough: Take the Tessalon perles as needed for cough relief.  You can also try honey 1/2 to 1 teaspoon (you can dilute the honey in water or another fluid).  You can also use guaifenesin and dextromethorphan for cough. You can use a humidifier for chest congestion and cough.   For your blood pressure: Take the amlodipine daily.  If you are able, monitor your blood pressure a few times a week.  Try to limit your sodium intake and stick with a DASH diet.  It is very important that you get established and follow up with a primary care provider as soon as possible.    Return or go to the Emergency Department if symptoms worsen or do not improve in the next few days.

## 2021-05-20 NOTE — ED Provider Notes (Addendum)
[facility]    CSN: [id number] Arrival date & time: 05/20/21  1435      History   Chief Complaint Chief Complaint  Patient presents with   APPOINTMENT  : HBP & Cough     HPI Philip Irwin. is a 30 y.o. male.   Patient here for evaluation of intermittent cough that has been ongoing for the past 3 weeks.  Reports recently had a URI but all symptoms resolved except for cough.  Denies any nasal congestion or fevers at this time.  Also reports elevated BP this weekend.  Patient attempted to have a procedure done at his dentist office but they were unable to perform the procedure to due his BP (128/101, 168/129, 168/122, 178/118).  Patient denies being diagnosed with HTN previously and does not take medication.  BP 196/128 in office.  Denies any headaches or blurred vision.  Denies any trauma, injury, or other precipitating event.  Denies any fevers, chest pain, shortness of breath, N/V/D, numbness, tingling, weakness, abdominal pain.     The history is provided by the patient.   History reviewed. No pertinent past medical history.  Patient Active Problem List   Diagnosis Date Noted   Routine general medical examination at a health care facility 07/05/2017    History reviewed. No pertinent surgical history.     Home Medications    Prior to Admission medications   Medication Sig Start Date End Date Taking? Authorizing Provider  amLODipine (NORVASC) 10 MG tablet Take 1 tablet (10 mg total) by mouth daily. 05/20/21  Yes Ivette Loyal, NP  benzonatate (TESSALON) 100 MG capsule Take 1 capsule (100 mg total) by mouth every 8 (eight) hours. 05/20/21  Yes Ivette Loyal, NP  fluticasone (FLONASE) 50 MCG/ACT nasal spray Place 2 sprays into both nostrils daily. 01/10/19   Cathie Hoops, Amy V, PA-C  ipratropium (ATROVENT) 0.06 % nasal spray Place 2 sprays into both nostrils 4 (four) times daily. 01/10/19   Belinda Fisher, PA-C    Family History History reviewed. No pertinent family  history.  Social History Social History   Tobacco Use   Smoking status: Never   Smokeless tobacco: Never  Substance Use Topics   Alcohol use: Yes    Comment: 8-10    Drug use: No     Allergies   Patient has no known allergies.   Review of Systems Review of Systems  Respiratory:  Positive for cough. Negative for shortness of breath.   Cardiovascular:  Negative for chest pain.  All other systems reviewed and are negative.   Physical Exam Triage Vital Signs ED Triage Vitals  Enc Vitals Group     BP 05/20/21 1458 (!) 168/150     Pulse Rate 05/20/21 1458 (!) 103     Resp 05/20/21 1458 20     Temp 05/20/21 1458 98.7 F (37.1 C)     Temp Source 05/20/21 1458 Oral     SpO2 05/20/21 1458 98 %     Weight --      Height --      Head Circumference --      Peak Flow --      Pain Score 05/20/21 1459 0     Pain Loc --      Pain Edu? --      Excl. in GC? --    No data found.  Updated Vital Signs BP (!) 196/128 (BP Location: Right Arm)   Pulse (!) 103  Temp 98.7 F (37.1 C) (Oral)   Resp 20   SpO2 98%   Visual Acuity Right Eye Distance:   Left Eye Distance:   Bilateral Distance:    Right Eye Near:   Left Eye Near:    Bilateral Near:     Physical Exam Vitals and nursing note reviewed.  Constitutional:      General: He is not in acute distress.    Appearance: Normal appearance. He is not ill-appearing, toxic-appearing or diaphoretic.  HENT:     Head: Normocephalic and atraumatic.     Nose: No congestion.  Eyes:     Conjunctiva/sclera: Conjunctivae normal.  Cardiovascular:     Rate and Rhythm: Normal rate.     Pulses: Normal pulses.     Heart sounds: Normal heart sounds.  Pulmonary:     Effort: Pulmonary effort is normal.     Breath sounds: Normal breath sounds.  Abdominal:     General: Abdomen is flat.  Musculoskeletal:        General: Normal range of motion.     Cervical back: Normal range of motion.  Skin:    General: Skin is warm and dry.   Neurological:     General: No focal deficit present.     Mental Status: He is alert and oriented to person, place, and time.  Psychiatric:        Mood and Affect: Mood normal.     UC Treatments / Results  Labs (all labs ordered are listed, but only abnormal results are displayed) Labs Reviewed  CBC  BASIC METABOLIC PANEL    EKG   Radiology No results found.  Procedures Procedures (including critical care time)  Medications Ordered in UC Medications - No data to display  Initial Impression / Assessment and Plan / UC Course  I have reviewed the triage vital signs and the nursing notes.  Pertinent labs & imaging results that were available during my care of the patient were reviewed by me and considered in my medical decision making (see chart for details).    Assessment negative for red flags or concerns.   Cough. Likely post viral syndrome.  May take tessalon perles as needed.  Discussed conservative symptom management as described in discharge instructions.   HTN.  Take amlodipine daily.  Will obtain CBC and BMP.  Monitor BP several times a week if able.  Recommend low sodium or DASH diet.  Stressed need to follow up with primary care so PCP assistance started. Strict ED follow up for any signs of hypertensive crisis.   Final Clinical Impressions(s) / UC Diagnoses   Final diagnoses:  Cough  Essential hypertension     Discharge Instructions      For cough: Take the Tessalon perles as needed for cough relief.  You can also try honey 1/2 to 1 teaspoon (you can dilute the honey in water or another fluid).  You can also use guaifenesin and dextromethorphan for cough. You can use a humidifier for chest congestion and cough.   For your blood pressure: Take the amlodipine daily.  If you are able, monitor your blood pressure a few times a week.  Try to limit your sodium intake and stick with a DASH diet.  It is very important that you get established and follow up with a  primary care provider as soon as possible.    Return or go to the Emergency Department if symptoms worsen or do not improve in the next few days.  ED Prescriptions     Medication Sig Dispense Auth. Provider   benzonatate (TESSALON) 100 MG capsule Take 1 capsule (100 mg total) by mouth every 8 (eight) hours. 21 capsule Chales Salmon R, NP   amLODipine (NORVASC) 10 MG tablet Take 1 tablet (10 mg total) by mouth daily. 30 tablet Ivette Loyal, NP      PDMP not reviewed this encounter.   Ivette Loyal, NP 05/20/21 1601    Ivette Loyal, NP 05/20/21 [phone]

## 2021-05-20 NOTE — ED Triage Notes (Signed)
Pt presents with ongoing intermittent cough X 3 weeks.

## 2021-06-02 ENCOUNTER — Ambulatory Visit: Payer: 59 | Admitting: Internal Medicine

## 2021-10-14 ENCOUNTER — Other Ambulatory Visit: Payer: Self-pay

## 2021-10-14 ENCOUNTER — Ambulatory Visit (HOSPITAL_COMMUNITY)
Admission: RE | Admit: 2021-10-14 | Discharge: 2021-10-14 | Disposition: A | Discharge status: Home / Self Care | Payer: BC Managed Care – PPO | Source: Ambulatory Visit | Attending: Family Medicine | Admitting: Family Medicine

## 2021-10-14 ENCOUNTER — Encounter (HOSPITAL_COMMUNITY): Payer: Self-pay

## 2021-10-14 ENCOUNTER — Ambulatory Visit (INDEPENDENT_AMBULATORY_CARE_PROVIDER_SITE_OTHER): Payer: BC Managed Care – PPO

## 2021-10-14 VITALS — BP 136/80 | HR 131 | Temp 99.6°F | Resp 20

## 2021-10-14 DIAGNOSIS — M25572 Pain in left ankle and joints of left foot: Secondary | ICD-10-CM

## 2021-10-14 DIAGNOSIS — M7989 Other specified soft tissue disorders: Secondary | ICD-10-CM | POA: Diagnosis not present

## 2021-10-14 DIAGNOSIS — S93402A Sprain of unspecified ligament of left ankle, initial encounter: Secondary | ICD-10-CM

## 2021-10-14 MED ORDER — NAPROXEN 375 MG PO TABS: 375.0000 mg | ORAL_TABLET | Freq: Two times a day (BID) | ORAL | 0 refills | Status: AC

## 2021-10-14 NOTE — ED Triage Notes (Signed)
Pt reports Sunday night tripped over toy on floor and rolled left ankle. Reports iced and elevated initially but pain and swelling progressed since. Pt states that pain is worse with weight bearing. Pt came in on crutches.

## 2021-10-14 NOTE — Discharge Instructions (Addendum)
Take Naprosyn twice daily for next 7-10 days. Wear boot with all weightbearing activities. If symptoms have not significantly improved by 10/19/2021 Go to the walk-in clinic listed above.  You may want to contact their office prior to arrival to ensure there hours have not changed due to holiday schedule.

## 2021-10-14 NOTE — ED Provider Notes (Signed)
[facility]    CSN: [id number] Arrival date & time: 10/14/21  1850      History   Chief Complaint Chief Complaint  Patient presents with   appt 7pm    Ankle Pain    HPI Philip Irwin. is a 30 y.o. male.   HPI Patient reports while walking in his bedroom 4 nights ago he tripped over a toy twisting his left ankle.  He has taken over-the-counter Tylenol however his ankle has continued to swell and he is continued to have worsening pain with weightbearing.  He is also applied ice to his ankle which has not improved swelling. Pain increased today which has caused him to use crutches with weightbearing activities. No prior injuries.  Patient has a low-grade temperature and is tachycardic on arrival here at urgent care.  Discussed with patient he reports he did have a viral respiratory illness which lasted most the week last week.  He reports that his symptoms have improved and he is no longer experiencing any cough, congestion and reports feeling well.   History reviewed. No pertinent past medical history.  Patient Active Problem List   Diagnosis Date Noted   Routine general medical examination at a health care facility 07/05/2017    History reviewed. No pertinent surgical history.     Home Medications    Prior to Admission medications   Medication Sig Start Date End Date Taking? Authorizing Provider  amLODipine (NORVASC) 10 MG tablet Take 1 tablet (10 mg total) by mouth daily. 05/20/21   Ivette Loyal, NP  benzonatate (TESSALON) 100 MG capsule Take 1 capsule (100 mg total) by mouth every 8 (eight) hours. 05/20/21   Ivette Loyal, NP  fluticasone (FLONASE) 50 MCG/ACT nasal spray Place 2 sprays into both nostrils daily. 01/10/19   Cathie Hoops, Amy V, PA-C  ipratropium (ATROVENT) 0.06 % nasal spray Place 2 sprays into both nostrils 4 (four) times daily. 01/10/19   Belinda Fisher, PA-C    Family History No family history on file.  Social History Social History    Tobacco Use   Smoking status: Never   Smokeless tobacco: Never  Substance Use Topics   Alcohol use: Yes    Comment: 8-10    Drug use: No     Allergies   Patient has no known allergies.   Review of Systems Review of Systems Pertinent negatives listed in HPI  Physical Exam Triage Vital Signs ED Triage Vitals  Enc Vitals Group     BP 10/14/21 1924 136/80     Pulse Rate 10/14/21 1924 (!) 131     Resp 10/14/21 1924 20     Temp 10/14/21 1924 99.6 F (37.6 C)     Temp Source 10/14/21 1924 Oral     SpO2 10/14/21 1924 97 %     Weight --      Height --      Head Circumference --      Peak Flow --      Pain Score 10/14/21 1922 8     Pain Loc --      Pain Edu? --      Excl. in GC? --    No data found.  Updated Vital Signs BP 136/80 (BP Location: Left Arm)    Pulse (!) 131    Temp 99.6 F (37.6 C) (Oral)    Resp 20    SpO2 97%   Visual Acuity Right Eye Distance:   Left Eye Distance:  Bilateral Distance:    Right Eye Near:   Left Eye Near:    Bilateral Near:     Physical Exam Constitutional:      Appearance: He is obese.  HENT:     Head: Normocephalic and atraumatic.  Cardiovascular:     Rate and Rhythm: Regular rhythm. Tachycardia present.  Pulmonary:     Effort: Pulmonary effort is normal.  Musculoskeletal:     Cervical back: Normal range of motion.     Left ankle: Swelling present. Tenderness present over the lateral malleolus and ATF ligament.     Left Achilles Tendon: Normal.  Skin:    General: Skin is warm.  Neurological:     Mental Status: He is alert.   UC Treatments / Results  Labs (all labs ordered are listed, but only abnormal results are displayed) Labs Reviewed - No data to display  EKG   Radiology DG Ankle Complete Left  Result Date: 10/14/2021 CLINICAL DATA:  Left ankle pain EXAM: LEFT ANKLE COMPLETE - 3+ VIEW COMPARISON:  None. FINDINGS: No fracture or dislocation is seen. The ankle mortise is intact. The base of the fifth  metatarsal is unremarkable. Mild lateral soft tissue swelling. IMPRESSION: No fracture or dislocation is seen. Mild lateral soft tissue swelling. Electronically Signed   By: Charline Bills M.D.   On: 10/14/2021 19:33    Procedures Procedures (including critical care time)  Medications Ordered in UC Medications - No data to display  Initial Impression / Assessment and Plan / UC Course  I have reviewed the triage vital signs and the nursing notes.  Pertinent labs & imaging results that were available during my care of the patient were reviewed by me and considered in my medical decision making (see chart for details).     Left ankle sprain Placed in a cam walker with crutches. Naproxen 375 mg twice daily BID  Continue RICE Follow-up with [facility] if symptoms have not significantly improved within 5 days. Final Clinical Impressions(s) / UC Diagnoses   Final diagnoses:  Sprain of left ankle, unspecified ligament, initial encounter   Discharge Instructions   None    ED Prescriptions     Medication Sig Dispense Auth. Provider   naproxen (NAPROSYN) 375 MG tablet Take 1 tablet (375 mg total) by mouth 2 (two) times daily for 10 days. 20 tablet Bing Neighbors, FNP      PDMP not reviewed this encounter.   Bing Neighbors, FNP 10/14/21 2040

## 2021-10-15 ENCOUNTER — Ambulatory Visit (HOSPITAL_COMMUNITY): Payer: Self-pay

## 2021-12-04 ENCOUNTER — Other Ambulatory Visit: Payer: Self-pay

## 2021-12-04 ENCOUNTER — Ambulatory Visit: Payer: BC Managed Care – PPO | Admitting: Nurse Practitioner

## 2021-12-04 ENCOUNTER — Encounter: Payer: Self-pay | Admitting: Nurse Practitioner

## 2021-12-04 VITALS — BP 172/100 | HR 102 | Temp 97.7°F | Resp 14 | Ht 73.75 in | Wt 360.1 lb

## 2021-12-04 DIAGNOSIS — Z23 Encounter for immunization: Secondary | ICD-10-CM

## 2021-12-04 DIAGNOSIS — Z0000 Encounter for general adult medical examination without abnormal findings: Secondary | ICD-10-CM

## 2021-12-04 DIAGNOSIS — Z7689 Persons encountering health services in other specified circumstances: Secondary | ICD-10-CM | POA: Diagnosis not present

## 2021-12-04 DIAGNOSIS — I10 Essential (primary) hypertension: Secondary | ICD-10-CM

## 2021-12-04 LAB — COMPREHENSIVE METABOLIC PANEL
ALT: 37 U/L (ref 0–53)
AST: 41 U/L — ABNORMAL HIGH (ref 0–37)
Albumin: 4.4 g/dL (ref 3.5–5.2)
Alkaline Phosphatase: 64 U/L (ref 39–117)
BUN: 9 mg/dL (ref 6–23)
CO2: 30 mEq/L (ref 19–32)
Calcium: 9.4 mg/dL (ref 8.4–10.5)
Chloride: 103 mEq/L (ref 96–112)
Creatinine, Ser: 1 mg/dL (ref 0.40–1.50)
GFR: 102.6 mL/min (ref >60.00)
Glucose, Bld: 118 mg/dL — ABNORMAL HIGH (ref 70–99)
Potassium: 4.6 mEq/L (ref 3.5–5.1)
Sodium: 141 mEq/L (ref 135–145)
Total Bilirubin: 0.6 mg/dL (ref 0.2–1.2)
Total Protein: 8.1 g/dL (ref 6.0–8.3)

## 2021-12-04 LAB — CBC WITH DIFFERENTIAL/PLATELET
Basophils Absolute: 0.1 10*3/uL (ref 0.0–0.1)
Basophils Relative: 1.3 % (ref 0.0–3.0)
Eosinophils Absolute: 0.2 10*3/uL (ref 0.0–0.7)
Eosinophils Relative: 2.8 % (ref 0.0–5.0)
HCT: 46.3 % (ref 39.0–52.0)
Hemoglobin: 15.2 g/dL (ref 13.0–17.0)
Lymphocytes Relative: 29 % (ref 12.0–46.0)
Lymphs Abs: 1.8 10*3/uL (ref 0.7–4.0)
MCHC: 32.8 g/dL (ref 30.0–36.0)
MCV: 76.7 fl — ABNORMAL LOW (ref 78.0–100.0)
Monocytes Absolute: 0.6 10*3/uL (ref 0.1–1.0)
Monocytes Relative: 8.8 % (ref 3.0–12.0)
Neutro Abs: 3.7 10*3/uL (ref 1.4–7.7)
Neutrophils Relative %: 58.1 % (ref 43.0–77.0)
Platelets: 323 10*3/uL (ref 150.0–400.0)
RBC: 6.04 Mil/uL — ABNORMAL HIGH (ref 4.22–5.81)
RDW: 14.1 % (ref 11.5–15.5)
WBC: 6.4 10*3/uL (ref 4.0–10.5)

## 2021-12-04 LAB — LIPID PANEL
Cholesterol: 133 mg/dL (ref 0–200)
HDL: 40.8 mg/dL (ref >39.00)
LDL Cholesterol: 68 mg/dL (ref 0–99)
NonHDL: 92.22
Total CHOL/HDL Ratio: 3
Triglycerides: 122 mg/dL (ref 0.0–149.0)
VLDL: 24.4 mg/dL (ref 0.0–40.0)

## 2021-12-04 LAB — HEMOGLOBIN A1C: Hgb A1c MFr Bld: 6.4 % (ref 4.6–6.5)

## 2021-12-04 LAB — TSH: TSH: 1.32 u[IU]/mL (ref 0.35–5.50)

## 2021-12-04 MED ORDER — AMLODIPINE BESYLATE 10 MG PO TABS: 10.0000 mg | ORAL_TABLET | Freq: Every day | ORAL | 0 refills | Status: DC

## 2021-12-04 NOTE — Progress Notes (Signed)
New Patient Office Visit  Subjective:  Patient ID: Philip Irwin., male    DOB: Jun 14, 1991  Age: 31 y.o. MRN: [id number]  CC:  Chief Complaint  Patient presents with   Establish Care   Hypertension    Would like to get this managed and refills done    HPI Philip Irwin. presents for Establish new doctor. for complete physical and follow up of chronic conditions.  Immunizations: -Tetanus: Need today -Influenza: refused -Covid-19: pfizer x2 and one booster -Shingles: NA -Pneumonia: NA   Diet: Fair diet. States that he eats one meal a day with snacking. He does meal prep. Water through the day. States sometimes soda Exercise:  States that he works out at home 30-45 mins 3-4 times weekly. Cardio on a treadmill  Eye exam:  Never done. Patient does not require corrective lenses  Dental exam: Completes semi-annually   Colonoscopy: NA PSA:  NA. Does have a grandfather with hx of prostate cancer  Lung Cancer Screening: NA. Non-smoker, never smoker  HTN: States that he has a blood pressure cuff but does not use. Was dx at urgent care. FH of hypertension. Was on amlodipine 10mg  daily and tolerated well in the past  Past Medical History:  Diagnosis Date   Hypertension     Past Surgical History:  Procedure Laterality Date   NO PAST SURGERIES      Family History  Problem Relation Age of Onset   Hypertension Maternal Grandmother    Diabetes Maternal Grandmother    Prostate cancer Maternal Grandfather    Stroke Maternal Grandfather    Breast cancer Paternal Grandmother     Social History   Socioeconomic History   Marital status: Single    Spouse name: Not on file   Number of children: Not on file   Years of education: Not on file   Highest education level: Not on file  Occupational History   Not on file  Tobacco Use   Smoking status: Never   Smokeless tobacco: Never   Tobacco comments:    None  Vaping Use   Vaping Use: Never used   Substance and Sexual Activity   Alcohol use: Not Currently    Comment: 4 to 5 drinks daily   Drug use: No   Sexual activity: Not Currently    Birth control/protection: Abstinence, Condom  Other Topics Concern   Not on file  Social History Narrative   Not on file   Social Determinants of Health   Financial Resource Strain: Not on file  Food Insecurity: Not on file  Transportation Needs: Not on file  Physical Activity: Not on file  Stress: Not on file  Social Connections: Not on file  Intimate Partner Violence: Not on file    ROS Review of Systems  Constitutional:  Negative for chills, fatigue and fever.  Eyes:  Negative for visual disturbance.  Respiratory:  Negative for cough and shortness of breath.   Cardiovascular:  Negative for chest pain, palpitations and leg swelling.  Gastrointestinal:  Negative for abdominal pain, blood in stool, diarrhea, nausea and vomiting.  Endocrine: Negative for polydipsia, polyphagia and polyuria.  Genitourinary:  Negative for difficulty urinating, dysuria, hematuria, penile discharge, penile pain, penile swelling, scrotal swelling and testicular pain.       Nocturia intermittent not nightly  Musculoskeletal:  Negative for arthralgias and myalgias.  Neurological:  Negative for dizziness, light-headedness, numbness and headaches.  Psychiatric/Behavioral:  Negative for hallucinations and suicidal ideas.  Objective:   Today's Vitals: BP (!) 172/100    Pulse (!) 102    Temp 97.7 F (36.5 C)    Resp 14    Ht 6' 1.75" (1.873 m)    Wt (!) 360 lb 1 oz (163.3 kg)    SpO2 97%    BMI 46.54 kg/m   Physical Exam Vitals and nursing note reviewed. Exam conducted with a chaperone present [employer], RMA).  Constitutional:      Appearance: He is obese.  HENT:     Right Ear: Tympanic membrane, ear canal and external ear normal.     Left Ear: Tympanic membrane, ear canal and external ear normal.     Mouth/Throat:     Mouth: Mucous membranes  are moist.     Pharynx: Oropharynx is clear.  Eyes:     Extraocular Movements: Extraocular movements intact.     Pupils: Pupils are equal, round, and reactive to light.  Neck:     Thyroid: No thyroid mass, thyromegaly or thyroid tenderness.  Cardiovascular:     Rate and Rhythm: Normal rate and regular rhythm.     Pulses: Normal pulses.     Heart sounds: Normal heart sounds.  Pulmonary:     Effort: Pulmonary effort is normal.     Breath sounds: Normal breath sounds.  Abdominal:     General: Bowel sounds are normal. There is no distension.     Palpations: There is no mass.     Tenderness: There is no abdominal tenderness.     Hernia: There is no hernia in the left inguinal area or right inguinal area.  Genitourinary:    Penis: Uncircumcised.      Testes: Normal.     Epididymis:     Right: Normal.     Left: Normal.  Musculoskeletal:     Right lower leg: No edema.     Left lower leg: No edema.  Lymphadenopathy:     Cervical: No cervical adenopathy.     Lower Body: No right inguinal adenopathy. No left inguinal adenopathy.  Skin:    General: Skin is warm.  Neurological:     General: No focal deficit present.     Mental Status: He is alert.     Deep Tendon Reflexes:     Reflex Scores:      Bicep reflexes are 2+ on the right side and 2+ on the left side.      Patellar reflexes are 2+ on the right side and 2+ on the left side.    Comments: Bilateral upper and lower extremity strength 5/5  Psychiatric:        Mood and Affect: Mood normal.        Behavior: Behavior normal.        Thought Content: Thought content normal.        Judgment: Judgment normal.    Assessment & Plan:   Problem List Items Addressed This Visit       Cardiovascular and Mediastinum   Hypertension    Cervical diagnosis.  Was placed on medication for approximately 1 month from urgent care but they would not refill medication due to patient having primary care provider.  Patient has the ability to check  blood pressure at home but has not.  Encourage patient to check blood pressure daily for the next 2 weeks at different times of the day we will restart him on amlodipine 10 mg daily, medication he was already on.  We will see him  back in office in 2 weeks      Relevant Medications   amLODipine (NORVASC) 10 MG tablet   Other Relevant Orders   CBC with Differential/Platelet   Comprehensive metabolic panel   Hemoglobin A1c   TSH   Lipid panel     Other   Encounter to establish care with new doctor - Primary    Limited EMR review performed      Relevant Orders   CBC with Differential/Platelet   Comprehensive metabolic panel   Need for diphtheria-tetanus-pertussis (Tdap) vaccine    Updated today.      Relevant Orders   Tdap vaccine greater than or equal to 7yo IM   Preventative health care    Encouraged healthy lifestyle modifications.  Pending lab results      Morbid obesity [facility])    Did encourage appropriate nutrition and healthy lifestyle modifications.  Patient is exercising already encouraged him to increase.  Did encourage him to decrease on amount of alcohol he does consume.      Relevant Orders   Hemoglobin A1c   Lipid panel    Outpatient Encounter Medications as of 12/04/2021  Medication Sig   amLODipine (NORVASC) 10 MG tablet Take 1 tablet (10 mg total) by mouth daily.   [DISCONTINUED] amLODipine (NORVASC) 10 MG tablet Take 1 tablet (10 mg total) by mouth daily. (Patient not taking: Reported on 12/04/2021)   [DISCONTINUED] benzonatate (TESSALON) 100 MG capsule Take 1 capsule (100 mg total) by mouth every 8 (eight) hours.   [DISCONTINUED] fluticasone (FLONASE) 50 MCG/ACT nasal spray Place 2 sprays into both nostrils daily.   [DISCONTINUED] ipratropium (ATROVENT) 0.06 % nasal spray Place 2 sprays into both nostrils 4 (four) times daily.   No facility-administered encounter medications on file as of 12/04/2021.    Follow-up: Return in about 2 weeks (around 12/18/2021)  for for BP recheck and medication check.   This visit occurred during the SARS-CoV-2 public health emergency.  Safety protocols were in place, including screening questions prior to the visit, additional usage of staff PPE, and extensive cleaning of exam room while observing appropriate contact time as indicated for disinfecting solutions.   Romilda Garret, NP

## 2021-12-04 NOTE — Patient Instructions (Signed)
Nice to see you today I will start you back on the amlodipine 10mg  daily I want to see you back in 2 weeks to make sure you are tolerating the medications well and see what your blood pressure is. I want you to check you Blood pressure daily over the next two weeks at different times and write them down. Either send it to me via my chart or bring them to the office visit

## 2021-12-04 NOTE — Assessment & Plan Note (Signed)
Encouraged healthy lifestyle modifications.  Pending lab results

## 2021-12-04 NOTE — Assessment & Plan Note (Signed)
Updated today.

## 2021-12-04 NOTE — Assessment & Plan Note (Signed)
Cervical diagnosis.  Was placed on medication for approximately 1 month from urgent care but they would not refill medication due to patient having primary care provider.  Patient has the ability to check blood pressure at home but has not.  Encourage patient to check blood pressure daily for the next 2 weeks at different times of the day we will restart him on amlodipine 10 mg daily, medication he was already on.  We will see him back in office in 2 weeks

## 2021-12-04 NOTE — Assessment & Plan Note (Signed)
Limited EMR review performed

## 2021-12-04 NOTE — Assessment & Plan Note (Signed)
Did encourage appropriate nutrition and healthy lifestyle modifications.  Patient is exercising already encouraged him to increase.  Did encourage him to decrease on amount of alcohol he does consume.

## 2021-12-30 ENCOUNTER — Ambulatory Visit: Payer: BC Managed Care – PPO | Admitting: Family Medicine

## 2022-01-04 DIAGNOSIS — M25572 Pain in left ankle and joints of left foot: Secondary | ICD-10-CM | POA: Diagnosis not present

## 2022-01-08 ENCOUNTER — Ambulatory Visit: Payer: BC Managed Care – PPO | Admitting: Nurse Practitioner

## 2022-01-08 ENCOUNTER — Other Ambulatory Visit: Payer: Self-pay

## 2022-01-08 VITALS — BP 170/92 | HR 112 | Temp 97.6°F | Resp 14 | Ht 73.75 in | Wt 357.0 lb

## 2022-01-08 DIAGNOSIS — I10 Essential (primary) hypertension: Secondary | ICD-10-CM

## 2022-01-08 MED ORDER — LISINOPRIL 10 MG PO TABS: 10.0000 mg | ORAL_TABLET | Freq: Every day | ORAL | 0 refills | Status: DC

## 2022-01-08 NOTE — Patient Instructions (Signed)
Nice to see you today ?I will see you in 1 month for follow up. ?Continue to check your blood pressure at home like you have been ?I will need to check your labs at the next office visit  ?

## 2022-01-08 NOTE — Assessment & Plan Note (Signed)
States he has been taking his amlodipine 10 mg as prescribed.  Denies any adverse drug events.  Patient's blood pressures are still above goal in office and weight is recorded at home.  We will start patient on lisinopril 10 mg daily.  We will have him follow-up in 1 month for blood pressure recheck and BM P recheck.  Did discuss possible adverse events inclusive of angioedema and dry nagging cough. ?

## 2022-01-08 NOTE — Progress Notes (Signed)
? ?Established Patient Office Visit ? ?Subjective:  ?Patient ID: Philip Shore., male    DOB: [birth year]  Age: 31 y.o. MRN: [id number] ? ?CC:  ?Chief Complaint  ?Patient presents with  ? Hypertension  ?  Follow up  ? ? ?HPI ?Philip Shore. presents for hypertension follow up  ? ?States that he has been working on his alcohol consumption. Mon, tues, and Wednesday - no alcohol. Thursday night 2-3 drinks and Friday.  Saturday and Sunday 3-4 drinks. ?States that she is still meal prepping.  In regards to exercise he is having left ankle problems.  Was evaluated this past Monday orthopedist and has an MRI scheduled tomorrow with follow-up early next week. ?Patient has been checking his blood pressure at least once daily he did bring a log of his blood pressures and they all have been grossly above goal. ? ?Past Medical History:  ?Diagnosis Date  ? Hypertension   ? ? ?Past Surgical History:  ?Procedure Laterality Date  ? NO PAST SURGERIES    ? ? ?Family History  ?Problem Relation Age of Onset  ? Hypertension Mother   ?     diet and exercise controlled  ? Hypertension Maternal Grandmother   ? Diabetes Maternal Grandmother   ? Prostate cancer Maternal Grandfather   ? Stroke Maternal Grandfather   ? Breast cancer Paternal Grandmother   ? ? ?Social History  ? ?Socioeconomic History  ? Marital status: Single  ?  Spouse name: Not on file  ? Number of children: Not on file  ? Years of education: Not on file  ? Highest education level: Not on file  ?Occupational History  ? Not on file  ?Tobacco Use  ? Smoking status: Never  ? Smokeless tobacco: Never  ? Tobacco comments:  ?  None  ?Vaping Use  ? Vaping Use: Never used  ?Substance and Sexual Activity  ? Alcohol use: Yes  ?  Comment: whiskey 4-5 drinks daily  ? Drug use: No  ? Sexual activity: Not Currently  ?  Birth control/protection: Abstinence, Condom  ?Other Topics Concern  ? Not on file  ?Social History Narrative  ? BS in [employer] from [employer]  ? 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 ?Social Determinants of Health  ? ?Financial Resource Strain: Not on file  ?Food Insecurity: Not on file  ?Transportation Needs: Not on file  ?Physical Activity: Not on file  ?Stress: Not on file  ?Social Connections: Not on file  ?Intimate Partner Violence: Not on file  ? ? ?Outpatient Medications Prior to Visit  ?Medication Sig Dispense Refill  ? amLODipine (NORVASC) 10 MG tablet Take 1 tablet (10 mg total) by mouth daily. 90 tablet 0  ? naproxen (NAPROSYN) 375 MG tablet Take by mouth.    ? ?No facility-administered medications prior to visit.  ? ? ?No Known Allergies ? ?ROS ?Review of Systems  ?Constitutional:  Negative for chills and fever.  ?Eyes:  Negative for visual disturbance.  ?Respiratory:  Negative for shortness of breath.   ?Cardiovascular:  Negative for chest pain.  ?Neurological:  Negative for headaches.  ? ?  ?Objective:  ?  ?Physical Exam ?Vitals and nursing note reviewed.  ?Constitutional:   ?   Appearance: Normal appearance. He is obese.  ?Cardiovascular:  ?   Rate and Rhythm: Normal rate and regular rhythm.  ?   Heart sounds: Normal heart sounds.  ?Pulmonary:  ?   Effort: Pulmonary effort is normal.  ?  Breath sounds: Normal breath sounds.  ?Musculoskeletal:  ?   Right lower leg: No edema.  ?   Left lower leg: No edema.  ?Skin: ?   General: Skin is warm.  ?Neurological:  ?   Mental Status: He is alert.  ? ? ?BP (!) 170/92   Pulse (!) 112   Temp 97.6 ?F (36.4 ?C)   Resp 14   Ht 6' 1.75" (1.873 m)   Wt (!) 357 lb (161.9 kg)   SpO2 98%   BMI 46.15 kg/m?  ?Wt Readings from Last 3 Encounters:  ?01/08/22 (!) 357 lb (161.9 kg)  ?12/04/21 (!) 360 lb 1 oz (163.3 kg)  ?07/05/17 (!) 328 lb (148.8 kg)  ? ? ? ?Health Maintenance Due  ?Topic Date Due  ? Hepatitis C Screening  Never done  ? COVID-19 Vaccine (4 - Booster for Pfizer series) 02/07/2021  ? ? ?There are no preventive care reminders to display for this patient. ? ?Lab Results  ?Component Value Date  ? TSH 1.32  12/04/2021  ? ?Lab Results  ?Component Value Date  ? WBC 6.4 12/04/2021  ? HGB 15.2 12/04/2021  ? HCT 46.3 12/04/2021  ? MCV 76.7 Repeated and verified X2. (L) 12/04/2021  ? PLT 323.0 12/04/2021  ? ?Lab Results  ?Component Value Date  ? NA 141 12/04/2021  ? K 4.6 12/04/2021  ? CO2 30 12/04/2021  ? GLUCOSE 118 (H) 12/04/2021  ? BUN 9 12/04/2021  ? CREATININE 1.00 12/04/2021  ? BILITOT 0.6 12/04/2021  ? ALKPHOS 64 12/04/2021  ? AST 41 (H) 12/04/2021  ? ALT 37 12/04/2021  ? PROT 8.1 12/04/2021  ? ALBUMIN 4.4 12/04/2021  ? CALCIUM 9.4 12/04/2021  ? ANIONGAP 8 05/20/2021  ? GFR 102.60 12/04/2021  ? ?Lab Results  ?Component Value Date  ? CHOL 133 12/04/2021  ? ?Lab Results  ?Component Value Date  ? HDL 40.80 12/04/2021  ? ?Lab Results  ?Component Value Date  ? LDLCALC 68 12/04/2021  ? ?Lab Results  ?Component Value Date  ? TRIG 122.0 12/04/2021  ? ?Lab Results  ?Component Value Date  ? CHOLHDL 3 12/04/2021  ? ?Lab Results  ?Component Value Date  ? HGBA1C 6.4 12/04/2021  ? ? ?  ?Assessment & Plan:  ? ?Problem List Items Addressed This Visit   ? ?  ? Cardiovascular and Mediastinum  ? Hypertension - Primary  ?  States he has been taking his amlodipine 10 mg as prescribed.  Denies any adverse drug events.  Patient's blood pressures are still above goal in office and weight is recorded at home.  We will start patient on lisinopril 10 mg daily.  We will have him follow-up in 1 month for blood pressure recheck and BM P recheck.  Did discuss possible adverse events inclusive of angioedema and dry nagging cough. ?  ?  ? Relevant Medications  ? lisinopril (ZESTRIL) 10 MG tablet  ? ? ?Meds ordered this encounter  ?Medications  ? lisinopril (ZESTRIL) 10 MG tablet  ?  Sig: Take 1 tablet (10 mg total) by mouth daily.  ?  Dispense:  90 tablet  ?  Refill:  0  ?  Order Specific Question:   Supervising Provider  ?  Answer:   Roxy MannsOWER, MARNE A [[id number]]  ? ? ?Follow-up: Return in about 4 weeks (around 02/05/2022) for BP recheck and labs. ? ?This  visit occurred during the SARS-CoV-2 public health emergency.  Safety protocols were in place, including screening questions prior to the  visit, additional usage of staff PPE, and extensive cleaning of exam room while observing appropriate contact time as indicated for disinfecting solutions.   ? ? ?Audria Nine, NP ?

## 2022-01-09 DIAGNOSIS — M25572 Pain in left ankle and joints of left foot: Secondary | ICD-10-CM | POA: Diagnosis not present

## 2022-01-11 ENCOUNTER — Ambulatory Visit: Payer: BC Managed Care – PPO | Admitting: Nurse Practitioner

## 2022-01-11 DIAGNOSIS — M25572 Pain in left ankle and joints of left foot: Secondary | ICD-10-CM | POA: Diagnosis not present

## 2022-01-13 DIAGNOSIS — M79672 Pain in left foot: Secondary | ICD-10-CM | POA: Diagnosis not present

## 2022-01-13 DIAGNOSIS — M25572 Pain in left ankle and joints of left foot: Secondary | ICD-10-CM | POA: Diagnosis not present

## 2022-01-14 ENCOUNTER — Other Ambulatory Visit: Payer: Self-pay | Admitting: Orthopaedic Surgery

## 2022-01-14 DIAGNOSIS — M25572 Pain in left ankle and joints of left foot: Secondary | ICD-10-CM

## 2022-01-20 ENCOUNTER — Ambulatory Visit
Admission: RE | Admit: 2022-01-20 | Discharge: 2022-01-20 | Disposition: A | Discharge status: Home / Self Care | Payer: BC Managed Care – PPO | Source: Ambulatory Visit | Attending: Orthopaedic Surgery | Admitting: Orthopaedic Surgery

## 2022-01-20 DIAGNOSIS — M19072 Primary osteoarthritis, left ankle and foot: Secondary | ICD-10-CM | POA: Diagnosis not present

## 2022-01-20 DIAGNOSIS — M25572 Pain in left ankle and joints of left foot: Secondary | ICD-10-CM

## 2022-01-25 DIAGNOSIS — M25572 Pain in left ankle and joints of left foot: Secondary | ICD-10-CM | POA: Diagnosis not present

## 2022-01-25 DIAGNOSIS — M79672 Pain in left foot: Secondary | ICD-10-CM | POA: Diagnosis not present

## 2022-02-04 DIAGNOSIS — G8918 Other acute postprocedural pain: Secondary | ICD-10-CM | POA: Diagnosis not present

## 2022-02-04 DIAGNOSIS — M89371 Hypertrophy of bone, right ankle and foot: Secondary | ICD-10-CM | POA: Diagnosis not present

## 2022-02-04 DIAGNOSIS — Q6689 Other  specified congenital deformities of feet: Secondary | ICD-10-CM | POA: Diagnosis not present

## 2022-02-04 DIAGNOSIS — M25872 Other specified joint disorders, left ankle and foot: Secondary | ICD-10-CM | POA: Diagnosis not present

## 2022-02-04 DIAGNOSIS — M898X7 Other specified disorders of bone, ankle and foot: Secondary | ICD-10-CM | POA: Diagnosis not present

## 2022-02-04 DIAGNOSIS — M24072 Loose body in left ankle: Secondary | ICD-10-CM | POA: Diagnosis not present

## 2022-02-12 ENCOUNTER — Encounter: Payer: Self-pay | Admitting: Nurse Practitioner

## 2022-02-12 ENCOUNTER — Ambulatory Visit: Payer: BC Managed Care – PPO | Admitting: Nurse Practitioner

## 2022-02-12 DIAGNOSIS — I10 Essential (primary) hypertension: Secondary | ICD-10-CM | POA: Diagnosis not present

## 2022-02-12 MED ORDER — LISINOPRIL 10 MG PO TABS: 20.0000 mg | ORAL_TABLET | Freq: Every day | ORAL | 0 refills | Status: DC

## 2022-02-12 NOTE — Progress Notes (Signed)
? ?Established Patient Office Visit ? ?Subjective   ?Patient ID: Philip Irwin., male    DOB: 1991-01-16  Age: 31 y.o. MRN: [id number] ? ?Chief Complaint  ?Patient presents with  ? Hypertension  ?  Follow up  ? ? ? ? ?HTN: Taking Blood pressure recording the bps at home ?Taking medication as prescirbed with out side effect. Did bring in a log of his home blood pressure. Reviewed and all above goal. ? ? ?Ankle surgery: Bones touching on the left ankle. Surgery on 02/04/2022 and follow on 02/19/2022. Pain is doing pretty good. States they gave him oxycodone and states that after the first 2 days he was some constipated. States he took some stool softner and then went. States ? ? ?Review of Systems  ?Constitutional:  Negative for chills and fever.  ?Respiratory:  Negative for cough and shortness of breath.   ?Cardiovascular:  Negative for chest pain and palpitations.  ?Gastrointestinal:  Negative for abdominal pain, constipation, nausea and vomiting.  ?Genitourinary:  Negative for dysuria and frequency.  ? ?  ?Objective:  ?  ? ?BP (!) 150/96   Pulse (!) 103   Temp (!) 96.6 ?F (35.9 ?C)   Resp 16   Ht 6' 1.75" (1.873 m)   Wt (!) 352 lb 6 oz (159.8 kg)   SpO2 97%   BMI 45.55 kg/m?  ?BP Readings from Last 3 Encounters:  ?02/12/22 (!) 150/96  ?01/08/22 (!) 170/92  ?12/04/21 (!) 172/100  ? ?Wt Readings from Last 3 Encounters:  ?02/12/22 (!) 352 lb 6 oz (159.8 kg)  ?01/08/22 (!) 357 lb (161.9 kg)  ?12/04/21 (!) 360 lb 1 oz (163.3 kg)  ? ?  ? ?Physical Exam ?Vitals and nursing note reviewed.  ?Constitutional:   ?   Appearance: Normal appearance. He is obese.  ?Cardiovascular:  ?   Rate and Rhythm: Regular rhythm. Tachycardia present.  ?   Heart sounds: Normal heart sounds.  ?Pulmonary:  ?   Effort: Pulmonary effort is normal.  ?   Breath sounds: Normal breath sounds.  ?Abdominal:  ?   General: Bowel sounds are normal.  ?Musculoskeletal:  ?   Comments: Left lower leg in cast  ?Skin: ?   General: Skin is warm.   ?   Capillary Refill: Capillary refill takes less than 2 seconds.  ?Neurological:  ?   Mental Status: He is alert.  ? ? ? ?No results found for any visits on 02/12/22. ? ? ? ?The ASCVD Risk score (Arnett DK, et al., 2019) failed to calculate for the following reasons: ?  The 2019 ASCVD risk score is only valid for ages 73 to 48 ? ?  ?Assessment & Plan:  ? ?Problem List Items Addressed This Visit   ? ?  ? Cardiovascular and Mediastinum  ? Hypertension  ?  Patient currently taking amlodipine 10 mg and lisinopril 10 mg.  Blood pressure still above goal even with patient checking it at home.  Looking in the chart we have made modest decreases each visit.  Patient will continue taking amlodipine 10 mg and increase lisinopril from 10 mg to 20 mg daily.  Pending BMP today to check potassium and renal function continue checking blood pressures and recording them at home ? ?  ?  ? Relevant Medications  ? lisinopril (ZESTRIL) 10 MG tablet  ? Other Relevant Orders  ? BASIC METABOLIC PANEL WITH GFR  ?  ? Other  ? Morbid obesity (HCC)  ?  Patient currently  not able to do his normal exercise doing to left ankle surgery recently.  Looking back at patient's weights he has had a modest decrease in weight since starting weight of 360 current weight today 352.  Gave praise and encouraged additional weight loss. ? ?  ?  ? ? ?Return in about 4 weeks (around 03/12/2022) for BP recheck.  ? ? ?Audria Nine, NP ? ?

## 2022-02-12 NOTE — Assessment & Plan Note (Signed)
Patient currently taking amlodipine 10 mg and lisinopril 10 mg.  Blood pressure still above goal even with patient checking it at home.  Looking in the chart we have made modest decreases each visit.  Patient will continue taking amlodipine 10 mg and increase lisinopril from 10 mg to 20 mg daily.  Pending BMP today to check potassium and renal function continue checking blood pressures and recording them at home ?

## 2022-02-12 NOTE — Patient Instructions (Signed)
Nice to see you today ?We are going to increase the lisinopril from 10mg  daily to 20mg  daily. You can take 2 tablets of the lisinopril 10mg  until you are out then I will send in an updated prescription to the pharmacy ? ?I want you to continue to take the amlodpine 10 mg daily ? ?I will see you in 1 month, follow up sooner if needed ? ?Continue checking your blood pressure for  me. I appreciate you doing that already ?

## 2022-02-12 NOTE — Assessment & Plan Note (Signed)
Patient currently not able to do his normal exercise doing to left ankle surgery recently.  Looking back at patient's weights he has had a modest decrease in weight since starting weight of 360 current weight today 352.  Gave praise and encouraged additional weight loss. ?

## 2022-02-13 LAB — BASIC METABOLIC PANEL WITH GFR
BUN: 10 mg/dL (ref 7–25)
CO2: 26 mmol/L (ref 20–32)
Calcium: 10.4 mg/dL — ABNORMAL HIGH (ref 8.6–10.3)
Chloride: 103 mmol/L (ref 98–110)
Creat: 1.07 mg/dL (ref 0.60–1.24)
Glucose, Bld: 100 mg/dL — ABNORMAL HIGH (ref 65–99)
Potassium: 4.9 mmol/L (ref 3.5–5.3)
Sodium: 140 mmol/L (ref 135–146)
eGFR: 97 mL/min/{1.73_m2} (ref >=60)

## 2022-03-02 ENCOUNTER — Other Ambulatory Visit: Payer: Self-pay | Admitting: Nurse Practitioner

## 2022-03-02 DIAGNOSIS — I10 Essential (primary) hypertension: Secondary | ICD-10-CM

## 2022-03-12 ENCOUNTER — Encounter: Payer: Self-pay | Admitting: Nurse Practitioner

## 2022-03-12 ENCOUNTER — Ambulatory Visit: Payer: BC Managed Care – PPO | Admitting: Nurse Practitioner

## 2022-03-12 VITALS — BP 144/96 | HR 102 | Temp 97.6°F | Wt 352.0 lb

## 2022-03-12 DIAGNOSIS — I10 Essential (primary) hypertension: Secondary | ICD-10-CM

## 2022-03-12 MED ORDER — LISINOPRIL 40 MG PO TABS: 40.0000 mg | ORAL_TABLET | Freq: Every day | ORAL | 1 refills | Status: DC

## 2022-03-12 NOTE — Progress Notes (Signed)
   Established Patient Office Visit  Subjective   Patient ID: Philip Space., male    DOB: Feb 28, 1991  Age: 30 y.o. MRN: [id number]  Chief Complaint  Patient presents with   Follow-up    Blood pressure, feels it has not changed much    HPI HTN: currently on amlodipine 10 mg and lisinopril 20 mg daily.  Patient does check his blood pressure daily at home and is still above goal.  Patient did bring in his blood pressure cuff today and his reading was 166/98 I did recheck same arm manually and at 144/96.  The discrepancy likely is due to cuff size as patient uses a large cuff here notes like a regular cuff on the machine.  He is still recovering from left lower extremity surgery.  He is in a cam walker now but still not able to exercise like he would like to.       Review of Systems  Constitutional:  Negative for chills and fever.  Eyes:  Negative for blurred vision.  Respiratory:  Negative for shortness of breath.   Cardiovascular:  Negative for chest pain and leg swelling.  Neurological:  Negative for dizziness and headaches.     Objective:     BP (!) 144/96   Pulse (!) 102   Temp 97.6 F (36.4 C) (Temporal)   Wt (!) 352 lb (159.7 kg)   SpO2 98%   BMI 45.50 kg/m    Physical Exam Vitals and nursing note reviewed.  Constitutional:      Appearance: Normal appearance. He is obese.  Cardiovascular:     Rate and Rhythm: Normal rate and regular rhythm.     Heart sounds: Normal heart sounds.  Pulmonary:     Effort: Pulmonary effort is normal.     Breath sounds: Normal breath sounds.  Neurological:     Mental Status: He is alert.     No results found for any visits on 03/12/22.    The ASCVD Risk score (Arnett DK, et al., 2019) failed to calculate for the following reasons:   The 2019 ASCVD risk score is only valid for ages 1 to 36    Assessment & Plan:   Problem List Items Addressed This Visit       Cardiovascular and Mediastinum   Hypertension -  Primary    Patient has had modest improvement in blood pressure.  Patient currently maintained on amlodipine 10 mg and lisinopril 20 mg.  We will titrate lisinopril from 20 mg to 40 mg.  Patient to continue checking blood pressure at home and will alert me if he becomes symptomatic or has low blood pressure readings.  We will have patient follow-up in 3 months for checkup.  Sooner if needed       Relevant Medications   lisinopril (ZESTRIL) 40 MG tablet    Return in about 3 months (around 06/12/2022) for HTN recheck.    Audria Nine, NP

## 2022-03-12 NOTE — Patient Instructions (Signed)
Nice to see you today Follow up with me in 3 months, sooner if you need me I switched your lisinopril to 40mg  tablets, so you will take one a day

## 2022-03-12 NOTE — Assessment & Plan Note (Signed)
Patient has had modest improvement in blood pressure.  Patient currently maintained on amlodipine 10 mg and lisinopril 20 mg.  We will titrate lisinopril from 20 mg to 40 mg.  Patient to continue checking blood pressure at home and will alert me if he becomes symptomatic or has low blood pressure readings.  We will have patient follow-up in 3 months for checkup.  Sooner if needed

## 2022-03-19 DIAGNOSIS — M24072 Loose body in left ankle: Secondary | ICD-10-CM | POA: Diagnosis not present

## 2022-04-08 ENCOUNTER — Other Ambulatory Visit: Payer: Self-pay | Admitting: Nurse Practitioner

## 2022-04-08 DIAGNOSIS — I10 Essential (primary) hypertension: Secondary | ICD-10-CM

## 2022-06-18 ENCOUNTER — Ambulatory Visit: Payer: BC Managed Care – PPO | Admitting: Nurse Practitioner

## 2022-06-29 ENCOUNTER — Ambulatory Visit: Payer: BC Managed Care – PPO | Admitting: Nurse Practitioner

## 2022-10-03 ENCOUNTER — Other Ambulatory Visit: Payer: Self-pay | Admitting: Nurse Practitioner

## 2022-10-03 DIAGNOSIS — I10 Essential (primary) hypertension: Secondary | ICD-10-CM

## 2022-10-22 ENCOUNTER — Ambulatory Visit: Payer: Self-pay | Admitting: Nurse Practitioner

## 2022-10-27 ENCOUNTER — Other Ambulatory Visit: Payer: Self-pay | Admitting: Nurse Practitioner

## 2022-10-27 DIAGNOSIS — I10 Essential (primary) hypertension: Secondary | ICD-10-CM

## 2022-10-29 ENCOUNTER — Encounter: Payer: Self-pay | Admitting: Nurse Practitioner

## 2022-10-29 ENCOUNTER — Ambulatory Visit (INDEPENDENT_AMBULATORY_CARE_PROVIDER_SITE_OTHER): Payer: Self-pay | Admitting: Nurse Practitioner

## 2022-10-29 VITALS — BP 150/94 | HR 92 | Temp 98.3°F | Ht 73.75 in | Wt 355.0 lb

## 2022-10-29 DIAGNOSIS — I10 Essential (primary) hypertension: Secondary | ICD-10-CM

## 2022-10-29 DIAGNOSIS — J029 Acute pharyngitis, unspecified: Secondary | ICD-10-CM | POA: Insufficient documentation

## 2022-10-29 MED ORDER — LISINOPRIL 40 MG PO TABS: 40.0000 mg | ORAL_TABLET | Freq: Every day | ORAL | 0 refills | Status: AC

## 2022-10-29 MED ORDER — AMLODIPINE BESYLATE 5 MG PO TABS: 5.0000 mg | ORAL_TABLET | Freq: Every day | ORAL | 0 refills | Status: DC

## 2022-10-29 NOTE — Assessment & Plan Note (Signed)
Defer strep testing today.  Patient can use over-the-counter throat lozenges, Flonase nasal spray, and drink plenty of fluid.  Can also do an at home COVID test to rule it out.  He can follow-up if no improvement

## 2022-10-29 NOTE — Patient Instructions (Signed)
Nice to see you today We will continue the lisinopril and add on the amlodipine Follow up with me in 3 months for your physical once you have health insurance.  Can try over the counter throat lozenges, flonase, and drinking plenty of fluid to help with the throat

## 2022-10-29 NOTE — Progress Notes (Signed)
Established Patient Office Visit  Subjective   Patient ID: Philip Irwin., male    DOB: 1991/02/16  Age: 31 y.o. MRN: [id number]  Chief Complaint  Patient presents with   Blood Pressure Check    HPI  Last CPE was done 12/04/2021.  HTN: Patient was originally seen by me on 12/04/2021 at that point he was noted to have elevated blood pressure.  Patient was started on medication and had several follow-ups on 01/08/2022, 02/12/2022, 03/12/2022.  At last office visit blood pressure was slightly above goal and patient was currently on lisinopril 40 mg and amlodipine 10mg .  He is here today for follow-up.  States that he thought he was suppose to stop the amlodipine once he ran out of the medication.  So this what he did.  1 question patient did not have any adverse drug events amlodipine.  He denies lower extremity edema from the medication States that he does have the blood pressure cuff cuff if he needs it.  Diet: states fair. 3 meals a day. States that he will skip breakfast or will have a small breakfast like a protein bar. States lunch is small and generally a cooked meal for dinner Exercise: has fallen off the wagon. He was doing 30-45 mins of treadmill cardio a day but has not since the holidays.   Sore throat: states started about 2-3 days ago. He has been around grandparents that has had a cold. He has not covid tested Coivd: pfizer x2 and one booster       Review of Systems  Constitutional:  Negative for chills and fever.  Eyes:  Negative for blurred vision and double vision.  Respiratory:  Negative for cough and shortness of breath.   Cardiovascular:  Negative for chest pain.  Gastrointestinal:  Negative for abdominal pain, constipation, diarrhea, nausea and vomiting.  Neurological:  Negative for dizziness and headaches.      Objective:     BP (!) 150/94   Pulse 92   Temp 98.3 F (36.8 C) (Temporal)   Ht 6' 1.75" (1.873 m)   Wt (!) 355 lb (161 kg)    SpO2 98%   BMI 45.89 kg/m  BP Readings from Last 3 Encounters:  10/29/22 (!) 150/94  03/12/22 (!) 144/96  02/12/22 (!) 150/96   Wt Readings from Last 3 Encounters:  10/29/22 (!) 355 lb (161 kg)  03/12/22 (!) 352 lb (159.7 kg)  02/12/22 (!) 352 lb 6 oz (159.8 kg)      Physical Exam Vitals and nursing note reviewed.  Constitutional:      Appearance: Normal appearance.  HENT:     Mouth/Throat:     Mouth: Mucous membranes are moist.     Pharynx: Oropharynx is clear. Posterior oropharyngeal erythema present.  Cardiovascular:     Rate and Rhythm: Normal rate and regular rhythm.     Heart sounds: Normal heart sounds.  Pulmonary:     Effort: Pulmonary effort is normal.     Breath sounds: Normal breath sounds.  Musculoskeletal:     Right lower leg: No edema.     Left lower leg: No edema.  Lymphadenopathy:     Cervical: No cervical adenopathy.  Neurological:     Mental Status: He is alert.      No results found for any visits on 10/29/22.    The ASCVD Risk score (Arnett DK, et al., 2019) failed to calculate for the following reasons:   The 2019 ASCVD risk score is  only valid for ages 69 to 30    Assessment & Plan:   Problem List Items Addressed This Visit       Cardiovascular and Mediastinum   Hypertension - Primary    Patient's blood pressure slightly above goal.  He has stopped exercising over the holidays per his report.  Currently maintained on lisinopril 40 mg denies adverse drug events inclusive of dry cough.  Will add on amlodipine 5 mg as patient is close to goal.  Follow-up in 3 months for recheck and physical      Relevant Medications   lisinopril (ZESTRIL) 40 MG tablet   amLODipine (NORVASC) 5 MG tablet     Other   Morbid obesity ([place])    Patient has gained a few pounds since last office visit approximately 7 months ago.  States he is maintaining his diet and is planning on starting back on his exercise routine.      Sore throat    Defer strep  testing today.  Patient can use over-the-counter throat lozenges, Flonase nasal spray, and drink plenty of fluid.  Can also do an at home COVID test to rule it out.  He can follow-up if no improvement       Return in about 3 months (around 01/28/2023) for CPE and Labs.    Romilda Garret, NP

## 2022-10-29 NOTE — Assessment & Plan Note (Signed)
Patient's blood pressure slightly above goal.  He has stopped exercising over the holidays per his report.  Currently maintained on lisinopril 40 mg denies adverse drug events inclusive of dry cough.  Will add on amlodipine 5 mg as patient is close to goal.  Follow-up in 3 months for recheck and physical

## 2022-10-29 NOTE — Assessment & Plan Note (Signed)
Patient has gained a few pounds since last office visit approximately 7 months ago.  States he is maintaining his diet and is planning on starting back on his exercise routine.

## 2023-01-17 ENCOUNTER — Encounter: Payer: Self-pay | Admitting: Pharmacist

## 2023-01-28 ENCOUNTER — Ambulatory Visit (INDEPENDENT_AMBULATORY_CARE_PROVIDER_SITE_OTHER): Payer: BC Managed Care – PPO | Admitting: Nurse Practitioner

## 2023-01-28 ENCOUNTER — Encounter: Payer: Self-pay | Admitting: Nurse Practitioner

## 2023-01-28 VITALS — BP 158/98 | HR 91 | Temp 98.9°F | Resp 16 | Ht 74.0 in | Wt 348.0 lb

## 2023-01-28 DIAGNOSIS — Z0000 Encounter for general adult medical examination without abnormal findings: Secondary | ICD-10-CM | POA: Diagnosis not present

## 2023-01-28 DIAGNOSIS — R7303 Prediabetes: Secondary | ICD-10-CM | POA: Diagnosis not present

## 2023-01-28 DIAGNOSIS — I10 Essential (primary) hypertension: Secondary | ICD-10-CM

## 2023-01-28 LAB — CBC
HCT: 43.7 % (ref 39.0–52.0)
Hemoglobin: 14.4 g/dL (ref 13.0–17.0)
MCHC: 33 g/dL (ref 30.0–36.0)
MCV: 76.3 fl — ABNORMAL LOW (ref 78.0–100.0)
Platelets: 315 10*3/uL (ref 150.0–400.0)
RBC: 5.72 Mil/uL (ref 4.22–5.81)
RDW: 14.2 % (ref 11.5–15.5)
WBC: 7.6 10*3/uL (ref 4.0–10.5)

## 2023-01-28 LAB — COMPREHENSIVE METABOLIC PANEL
ALT: 51 U/L (ref 0–53)
AST: 67 U/L — ABNORMAL HIGH (ref 0–37)
Albumin: 4.5 g/dL (ref 3.5–5.2)
Alkaline Phosphatase: 62 U/L (ref 39–117)
BUN: 14 mg/dL (ref 6–23)
CO2: 27 mEq/L (ref 19–32)
Calcium: 9.9 mg/dL (ref 8.4–10.5)
Chloride: 102 mEq/L (ref 96–112)
Creatinine, Ser: 0.92 mg/dL (ref 0.40–1.50)
GFR: 112.49 mL/min (ref >60.00)
Glucose, Bld: 103 mg/dL — ABNORMAL HIGH (ref 70–99)
Potassium: 4.4 mEq/L (ref 3.5–5.1)
Sodium: 138 mEq/L (ref 135–145)
Total Bilirubin: 1.1 mg/dL (ref 0.2–1.2)
Total Protein: 7.6 g/dL (ref 6.0–8.3)

## 2023-01-28 LAB — LIPID PANEL
Cholesterol: 140 mg/dL (ref 0–200)
HDL: 48.7 mg/dL (ref >39.00)
LDL Cholesterol: 71 mg/dL (ref 0–99)
NonHDL: 91.61
Total CHOL/HDL Ratio: 3
Triglycerides: 102 mg/dL (ref 0.0–149.0)
VLDL: 20.4 mg/dL (ref 0.0–40.0)

## 2023-01-28 LAB — TSH: TSH: 1.27 u[IU]/mL (ref 0.35–5.50)

## 2023-01-28 LAB — HEMOGLOBIN A1C: Hgb A1c MFr Bld: 6.5 % (ref 4.6–6.5)

## 2023-01-28 MED ORDER — AMLODIPINE BESYLATE 10 MG PO TABS: 10.0000 mg | ORAL_TABLET | Freq: Every day | ORAL | 0 refills | Status: DC

## 2023-01-28 NOTE — Progress Notes (Signed)
Established Patient Office Visit  Subjective   Patient ID: Philip ShoreVincent Montrez Newhouse Jr., male    DOB: 08/14/1991  Age: 32 y.o. MRN: [id number]  Chief Complaint  Patient presents with   Annual Exam    HPI   HTN: states that he has not had any side effects from the medication. States that he is still checking. States that he is checking every other     for complete physical and follow up of chronic conditions.  Immunizations: -Tetanus: Completed in 2023 -Influenza: refused  -Shingles: Too young -Pneumonia: Too young -covid: Designer, industrial/productpfizer x2 and booster  Diet: Fair diet. States that he is doing protein shake in the am. Lunch is a protein bar. Dinner is a prepped meal. State that he will eat out for comfort Exercise: No regular exercise. States that he new job is walking a lot but not physical labor  Eye exam: As needed Dental exam: Completes semi-annually    Colonoscopy: Too young currently average risk Lung Cancer Screening: N/A  PSA: Too young, currently average risk  Sleep: goes to  bed 11 and will wake up aorund 6-630. Feels rested. States that he does not snore       Review of Systems  Constitutional:  Negative for chills and fever.  Respiratory:  Negative for shortness of breath.   Cardiovascular:  Negative for chest pain and leg swelling.  Gastrointestinal:  Negative for abdominal pain, blood in stool, constipation, diarrhea, nausea and vomiting.       BM daily   Genitourinary:  Negative for dysuria and hematuria.  Neurological:  Negative for dizziness, tingling and headaches.  Psychiatric/Behavioral:  Negative for hallucinations and suicidal ideas.       Objective:     BP (!) 158/98   Pulse 91   Temp 98.9 F (37.2 C)   Resp 16   Ht 6\' 2"  (1.88 m)   Wt (!) 348 lb (157.9 kg)   SpO2 99%   BMI 44.68 kg/m  BP Readings from Last 3 Encounters:  01/28/23 (!) 158/98  10/29/22 (!) 150/94  03/12/22 (!) 144/96   Wt Readings from Last 3 Encounters:  01/28/23 (!)  348 lb (157.9 kg)  10/29/22 (!) 355 lb (161 kg)  03/12/22 (!) 352 lb (159.7 kg)      Physical Exam Vitals and nursing note reviewed. Exam conducted with a chaperone present Tresa Endo(Kelly Fiscal, CMA).  Constitutional:      Appearance: Normal appearance.  HENT:     Right Ear: Tympanic membrane, ear canal and external ear normal.     Left Ear: Tympanic membrane, ear canal and external ear normal.     Mouth/Throat:     Mouth: Mucous membranes are moist.     Pharynx: Oropharynx is clear.  Eyes:     Extraocular Movements: Extraocular movements intact.     Pupils: Pupils are equal, round, and reactive to light.  Cardiovascular:     Rate and Rhythm: Normal rate and regular rhythm.     Pulses: Normal pulses.     Heart sounds: Normal heart sounds.  Pulmonary:     Effort: Pulmonary effort is normal.     Breath sounds: Normal breath sounds.  Abdominal:     General: Bowel sounds are normal. There is no distension.     Palpations: There is no mass.     Tenderness: There is no abdominal tenderness.     Hernia: No hernia is present. There is no hernia in the left inguinal area or  right inguinal area.  Genitourinary:    Penis: Normal.      Testes: Normal.     Epididymis:     Right: Normal.     Left: Normal.  Musculoskeletal:     Right lower leg: No edema.     Left lower leg: No edema.  Lymphadenopathy:     Cervical: No cervical adenopathy.     Lower Body: No right inguinal adenopathy. No left inguinal adenopathy.  Skin:    General: Skin is warm.  Neurological:     General: No focal deficit present.     Mental Status: He is alert.     Deep Tendon Reflexes:     Reflex Scores:      Bicep reflexes are 2+ on the right side and 2+ on the left side.      Patellar reflexes are 2+ on the right side and 2+ on the left side.    Comments: Bilateral upper and lower extremity strength 5/5  Psychiatric:        Mood and Affect: Mood normal.        Behavior: Behavior normal.        Thought Content:  Thought content normal.        Judgment: Judgment normal.      No results found for any visits on 01/28/23.    The ASCVD Risk score (Arnett DK, et al., 2019) failed to calculate for the following reasons:   The 2019 ASCVD risk score is only valid for ages 85 to 24    Assessment & Plan:   Problem List Items Addressed This Visit       Cardiovascular and Mediastinum   Hypertension - Primary    Patient currently maintained on lisinopril 40 and amlodipine 5 mg.  Blood pressure still above goal in office at home.  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  Did review patient's personal, surgical, social, family history.  Patient is up-to-date on his vaccinations he refused the flu vaccine.  Patient is too young for CRC screening or prostate cancer screening.  Patient was given information about preventative healthcare maintenance with anticipatory guidance.  Patient was encouraged to decrease his alcohol consumption to no more than 2 drinks a day.  And to increase his physical activity.      Relevant Orders   CBC   Comprehensive metabolic panel   TSH   Morbid obesity    Continue working on lifestyle modifications and exercise.      Relevant Orders   Hemoglobin A1c   TSH   Lipid panel   Prediabetes    Patient's last A1c was 6.4%.  Patient has lost a little bit of weight but still considered morbidly obese check A1c      Relevant Orders   Hemoglobin A1c   Lipid panel    Return in about 6 weeks (around 03/11/2023) for BP recheck.    Audria Nine, NP

## 2023-01-28 NOTE — Assessment & Plan Note (Signed)
Patient currently maintained on lisinopril 40 and amlodipine 5 mg.  Blood pressure still above goal in office at home.  Increase amlodipine 10 mg follow-up 6 weeks

## 2023-01-28 NOTE — Assessment & Plan Note (Signed)
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  Did review patient's personal, surgical, social, family history.  Patient is up-to-date on his vaccinations he refused the flu vaccine.  Patient is too young for CRC screening or prostate cancer screening.  Patient was given information about preventative healthcare maintenance with anticipatory guidance.  Patient was encouraged to decrease his alcohol consumption to no more than 2 drinks a day.  And to increase his physical activity.

## 2023-01-28 NOTE — Patient Instructions (Signed)
Nice to see you today I will be in touch with the labs once I have the results I want to see you in 6 weeks for a blood pressure recheck Work on cutting back the alcohol use. I would like no more than 2 drinks a day Try to increase your physical activity outside of employment   I am going to increase the blood pressure medication to 10mg  of amlodipine. You can take 2 tablets of the amlodipine 5mg  until you finish them

## 2023-01-28 NOTE — Assessment & Plan Note (Signed)
Continue working on lifestyle modifications and exercise.

## 2023-01-28 NOTE — Assessment & Plan Note (Signed)
Patient's last A1c was 6.4%.  Patient has lost a little bit of weight but still considered morbidly obese check A1c

## 2023-01-31 ENCOUNTER — Other Ambulatory Visit: Payer: Self-pay | Admitting: Nurse Practitioner

## 2023-01-31 DIAGNOSIS — E118 Type 2 diabetes mellitus with unspecified complications: Secondary | ICD-10-CM

## 2023-02-25 ENCOUNTER — Other Ambulatory Visit: Payer: Self-pay | Admitting: Nurse Practitioner

## 2023-02-25 DIAGNOSIS — I10 Essential (primary) hypertension: Secondary | ICD-10-CM

## 2023-02-25 NOTE — Telephone Encounter (Signed)
Renewal refill: amLODipine (NORVASC) 10 MG tablet   LR- 01/28/23 ( 90 tabs/ no refills) LV- 01/28/23 NV- 03/11/23

## 2023-03-11 ENCOUNTER — Ambulatory Visit: Payer: BC Managed Care – PPO | Admitting: Nurse Practitioner

## 2023-03-11 VITALS — BP 142/86 | HR 86 | Temp 97.6°F | Resp 16 | Ht 74.0 in | Wt 345.0 lb

## 2023-03-11 DIAGNOSIS — I10 Essential (primary) hypertension: Secondary | ICD-10-CM | POA: Diagnosis not present

## 2023-03-11 DIAGNOSIS — E118 Type 2 diabetes mellitus with unspecified complications: Secondary | ICD-10-CM

## 2023-03-11 NOTE — Progress Notes (Signed)
Established Patient Office Visit  Subjective   Patient ID: Philip Etcheverry., male    DOB: April 28, 1991  Age: 32 y.o. MRN: [id number]  Chief Complaint  Patient presents with   Hypertension      DM2: patient was seen on 01/28/2023 for CPE and noted to have an A1C of 6.5, previously he was a pre-diabetic.  HTN: uncontrolled hypertension patient is currently on amlodipine 10 and lisinopril 40. He does have the ability to check his blood pressure at home.  States that he is checking it in the evening. States that he has been doing the treadmill every day after work 30-27mins. States that he is doing it 3-4.  States that he has cut down on that. States that he has limited to the weekends states that he will do 3-4 drinks from 5-6 drinks.     Review of Systems  Constitutional:  Negative for chills and fever.  Respiratory:  Negative for shortness of breath.   Cardiovascular:  Negative for chest pain.  Gastrointestinal:  Negative for abdominal pain, constipation, diarrhea, nausea and vomiting.  Neurological:  Negative for headaches.      Objective:     BP (!) 142/86   Pulse 86   Temp 97.6 F (36.4 C)   Resp 16   Ht 6\' 2"  (1.88 m)   Wt (!) 345 lb (156.5 kg)   SpO2 100%   BMI 44.30 kg/m  BP Readings from Last 3 Encounters:  03/11/23 (!) 142/86  01/28/23 (!) 158/98  10/29/22 (!) 150/94   Wt Readings from Last 3 Encounters:  03/11/23 (!) 345 lb (156.5 kg)  01/28/23 (!) 348 lb (157.9 kg)  10/29/22 (!) 355 lb (161 kg)      Physical Exam Vitals and nursing note reviewed.  Constitutional:      Appearance: Normal appearance.  Cardiovascular:     Rate and Rhythm: Normal rate and regular rhythm.     Heart sounds: Normal heart sounds.  Pulmonary:     Effort: Pulmonary effort is normal.     Breath sounds: Normal breath sounds.  Neurological:     Mental Status: He is alert.      No results found for any visits on 03/11/23.    The ASCVD Risk score (Arnett  DK, et al., 2019) failed to calculate for the following reasons:   The 2019 ASCVD risk score is only valid for ages 68 to 75    Assessment & Plan:   Problem List Items Addressed This Visit       Cardiovascular and Mediastinum   Hypertension - Primary    Patient currently maintained on amlodipine 10 mg and lisinopril 40 mg.  Tolerating medication well.  Blood pressure almost at goal.  Did have a joint discussion he is going to continue working on his lifestyle modifications as he has been losing weight and working out to try to control his blood pressure.  Will follow-up in 3 months      Relevant Orders   Referral to Nutrition and Diabetes Services     Endocrine   Controlled type 2 diabetes mellitus with complication, without long-term current use of insulin (HCC)    A1c of 6.5%.  Did have discussion in office will send to diabetic services for nutritional education.  Referral placed today      Relevant Orders   Referral to Nutrition and Diabetes Services     Other   Morbid obesity (HCC)    Patient is working lifestyle  modifications he is exercising more regularly now.  Continue working on lifestyle modifications      Relevant Orders   Referral to Nutrition and Diabetes Services    Return in about 3 months (around 06/11/2023) for BP recheck, DM recheck.    Audria Nine, NP

## 2023-03-11 NOTE — Patient Instructions (Addendum)
Nice to see you today We will keep the medications the same Continue working on the lifestyle modifications Follow up with me in 3 months

## 2023-03-11 NOTE — Assessment & Plan Note (Signed)
Patient is working lifestyle modifications he is exercising more regularly now.  Continue working on lifestyle modifications

## 2023-03-11 NOTE — Assessment & Plan Note (Signed)
Patient currently maintained on amlodipine 10 mg and lisinopril 40 mg.  Tolerating medication well.  Blood pressure almost at goal.  Did have a joint discussion he is going to continue working on his lifestyle modifications as he has been losing weight and working out to try to control his blood pressure.  Will follow-up in 3 months

## 2023-03-11 NOTE — Assessment & Plan Note (Signed)
A1c of 6.5%.  Did have discussion in office will send to diabetic services for nutritional education.  Referral placed today

## 2023-05-12 IMAGING — CT CT ANKLE*L* W/O CM
1 series · 12 of 14 positions shown, 15 images · non-contrast
Comparison: None.

CLINICAL DATA: Left ankle pain rule out correlation.

EXAM:
CT OF THE LEFT ANKLE WITHOUT CONTRAST
TECHNIQUE: Multidetector CT imaging of the left ankle was performed according
to the standard protocol. Multiplanar CT image reconstructions were
also generated.
RADIATION DOSE REDUCTION: This exam was performed according to the
departmental dose-optimization program which includes automated
exposure control, adjustment of the mA and/or kV according to
patient size and/or use of iterative reconstruction technique.

[Series 4: lower ext 1.5 st · axial · 0.31mm/px · z∈[-248,-116]mm · 12 of 104 slices shown, 15 images]
[im 8/104  soft-tissue]
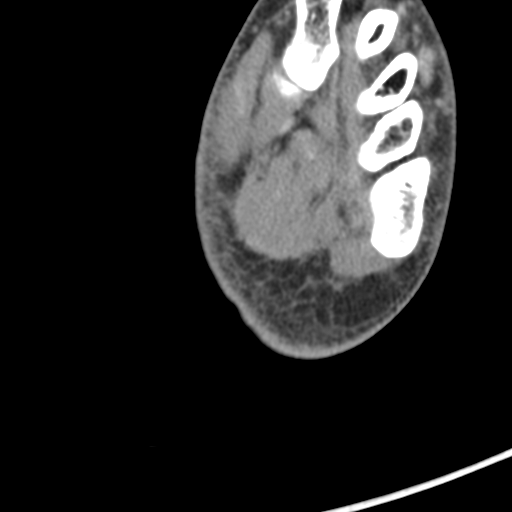
[im 8/104  bone]
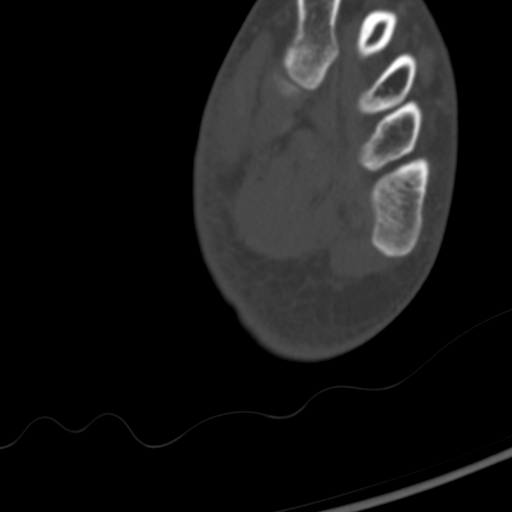
[im 16/104  bone]
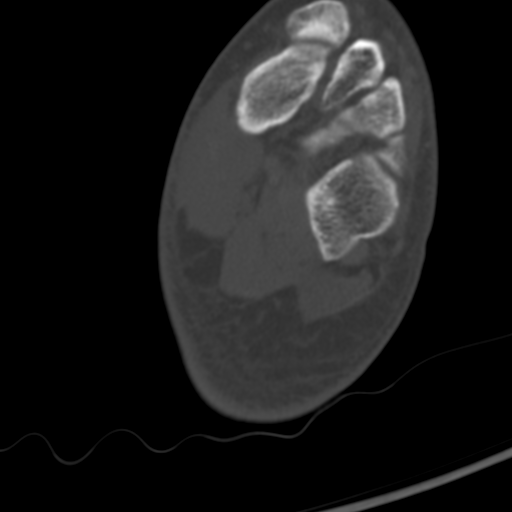
[im 24/104  bone]
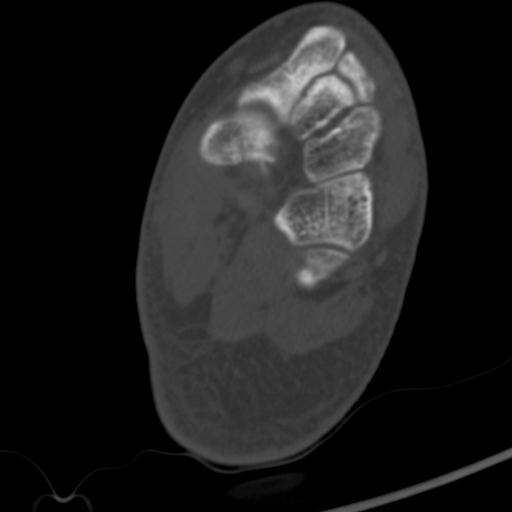
[im 32/104  bone]
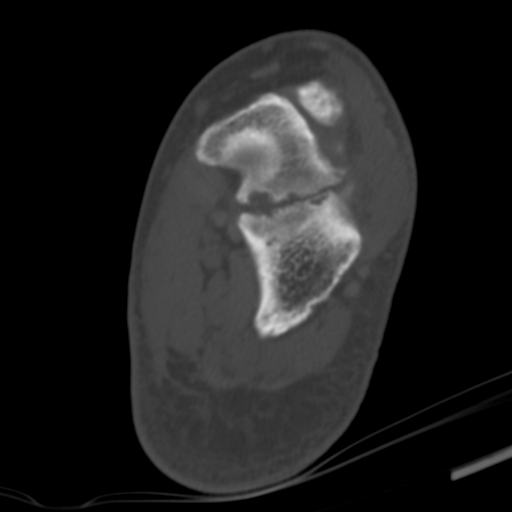
[im 40/104  soft-tissue]
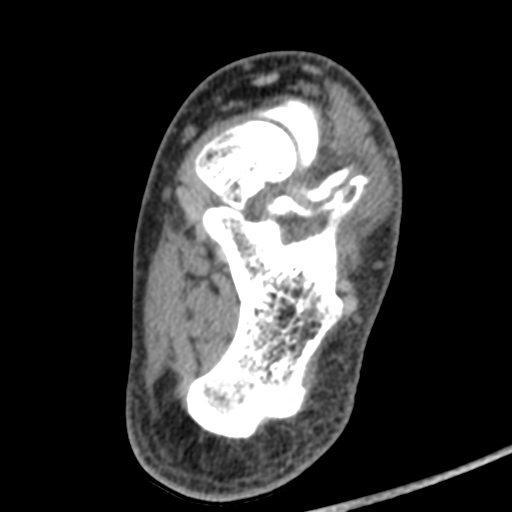
[im 40/104  bone]
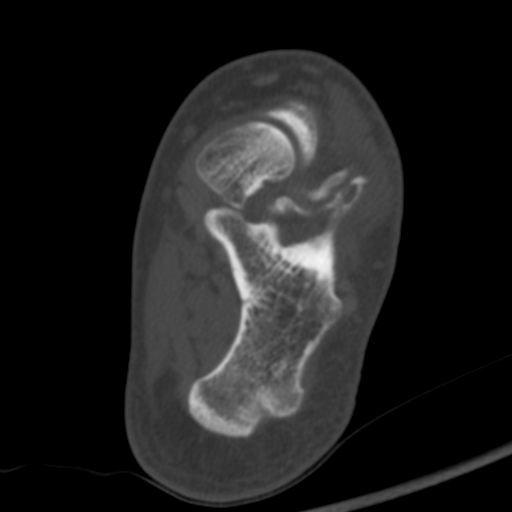
[im 48/104  bone]
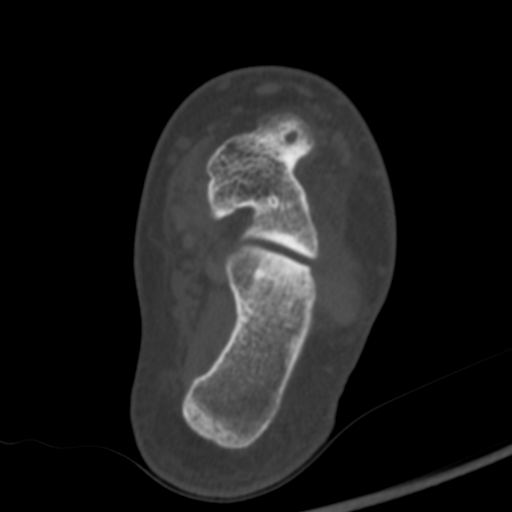
[im 56/104  bone]
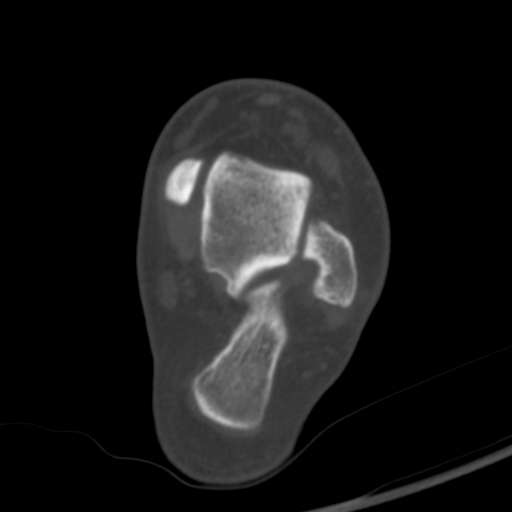
[im 64/104  bone]
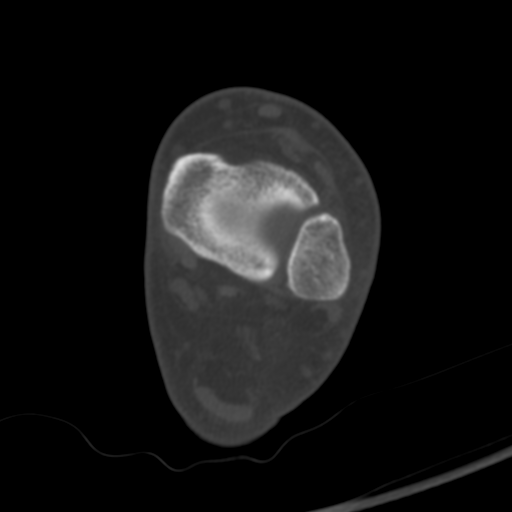
[im 72/104  soft-tissue]
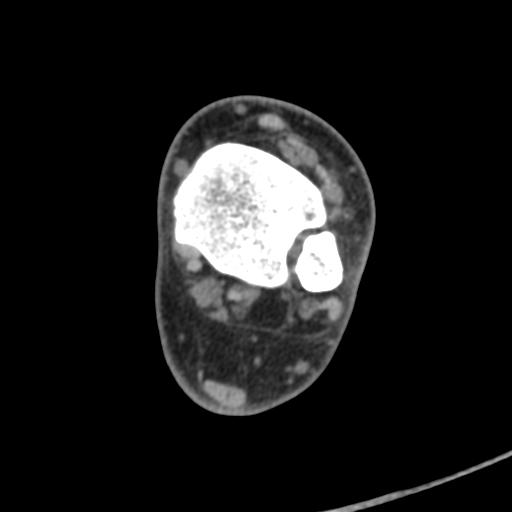
[im 72/104  bone]
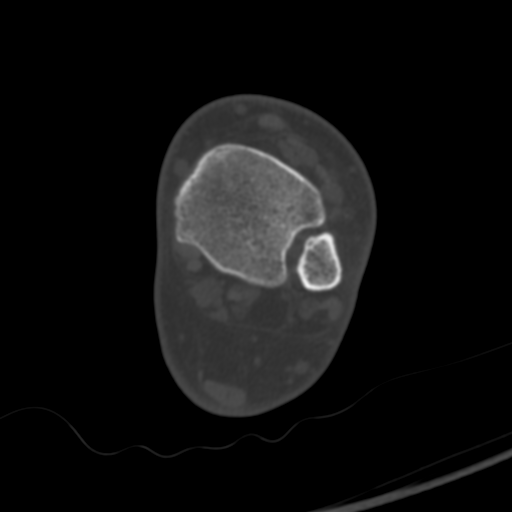
[im 80/104  bone]
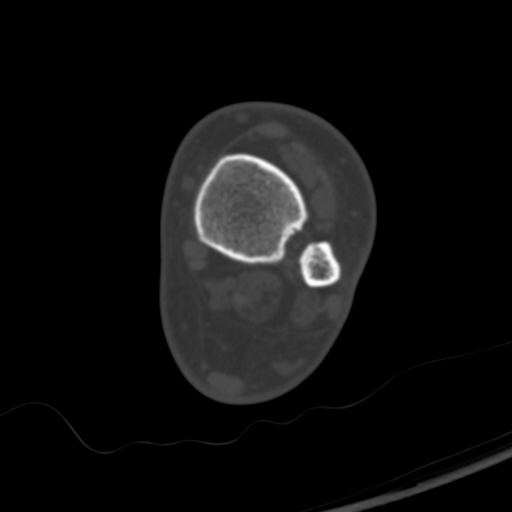
[im 88/104  bone]
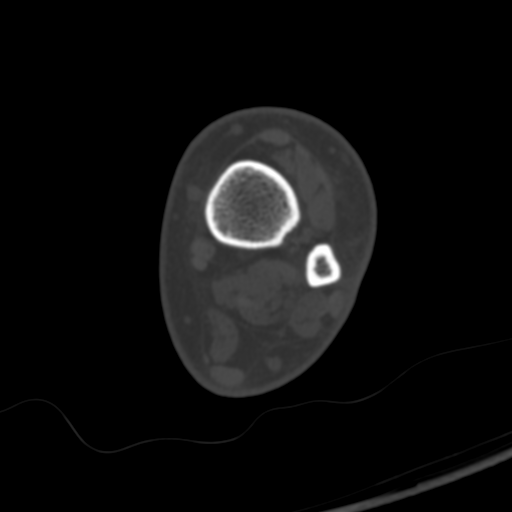
[im 96/104  bone]
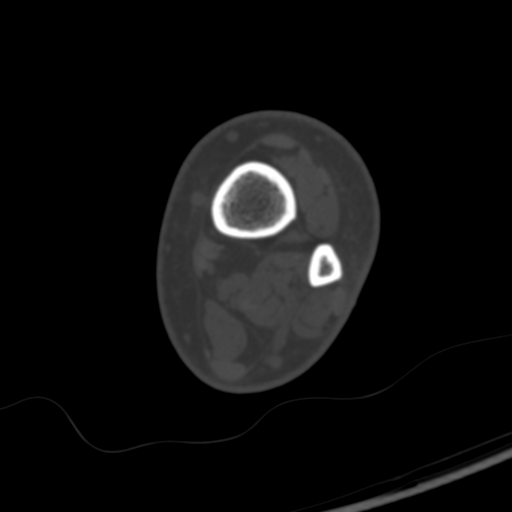

[12 of 14 positions shown; findings below may reference images not displayed]

FINDINGS: Bones/Joint/Cartilage

No fracture or dislocation. Normal alignment. No joint effusion.

There is elongated anterior process of the calcaneus with elongated
navicular bone. There is contour irregularity at the
calcaneal-navicular interface concerning for cartilaginous
coalition.

There is a prominent talar beak as well as osteophyte about the
superior aspect of the navicular bone, likely representing arthritic
changes secondary to altered mechanics.

Tibiotalar and posterior subtalar joint are normal.

Ligaments

Ligaments are suboptimally evaluated by CT.

Muscles and Tendons
Muscles are normal in bulk and density. No intramuscular collection.
The tendons of the anterior, flexor and peroneal compartments are
intact. Achilles tendon is intact.

Soft tissue
No fluid collection or hematoma. No soft tissue mass. Skin and
subcutaneous soft tissues are within normal limits.
IMPRESSION: 1. Findings most consistent with cartilaginous calcaneonavicular
coalition.

2. Arthritic changes at the talonavicular joint, likely secondary to
altered mechanics due to calcaneonavicular coalition.

3.  No evidence of fracture or dislocation.

## 2023-05-25 ENCOUNTER — Encounter (INDEPENDENT_AMBULATORY_CARE_PROVIDER_SITE_OTHER): Payer: Self-pay

## 2023-05-26 ENCOUNTER — Other Ambulatory Visit: Payer: Self-pay | Admitting: Nurse Practitioner

## 2023-05-26 DIAGNOSIS — I10 Essential (primary) hypertension: Secondary | ICD-10-CM

## 2023-06-02 ENCOUNTER — Encounter: Payer: BC Managed Care – PPO | Attending: Nurse Practitioner | Admitting: Dietician

## 2023-06-02 ENCOUNTER — Encounter: Payer: Self-pay | Admitting: Dietician

## 2023-06-02 VITALS — Ht 74.0 in | Wt 337.5 lb

## 2023-06-02 DIAGNOSIS — E118 Type 2 diabetes mellitus with unspecified complications: Secondary | ICD-10-CM | POA: Diagnosis not present

## 2023-06-02 DIAGNOSIS — I10 Essential (primary) hypertension: Secondary | ICD-10-CM

## 2023-06-02 NOTE — Progress Notes (Signed)
Medical Nutrition Therapy: Visit start time: 1050  end time: 1150  Assessment:   Referral Diagnosis: diabetes, HTN Other medical history/ diagnoses: none significant Psychosocial issues/ stress concerns: none  Medications, supplements: reconciled list in medical record   Preferred learning method:  Visual Hands-on    Current weight: 337.5lbs Height: 6'2" BMI: 43.33   Progress and evaluation:  Recent HbA1C 6.5% 01/28/23, up from 6.4, 6.1 in previous months. Lipid panel with normal results Patient has been working on diet and lifestyle changes, changing food choices and significantly decreasing alcohol and caloric intake.  Food allergies: none Special diet practices: none Patient seeks help with healthy food choices and eating pattern to control blood sugar.   Dietary Intake:  Usual eating pattern includes 2-3 meals and 2-3 snacks per day. Dining out frequency: 5 meals per week. Who plans meals/ buys groceries? self Who prepares meals? self  Breakfast: atkins protein shake or occ protein bar Snack: none Lunch: skips or protein bar Snack: chips, popcorn, occ candy ie gummy bears Supper: chicken with rice and broccoli or other veg; other meal on sundays Snack: same as pm Beverages: water 1-2 lg stanley cups; fruit juice; soda with alcohol on weekends, 1-2 during week  Physical activity: cardio 45-60 minutes, 3x a week, starting to add strength training   Intervention:   Nutrition Care Education:   Basic nutrition: basic food groups; appropriate nutrient balance; appropriate meal and snack schedule; general nutrition guidelines    Weight control: importance of low sugar and low fat choices; portion control; estimated energy needs for weight loss at 2100kcal, provided guidance for 40-45% CHO, 25-30% pro, 30% fat; maintaining healthy metabolism with exercise and eating regularly; making sustainable changes; effects of alcohol; limiting liquid calories Advanced nutrition: cooking  techniques-- pre-prepping meals like lunches, pre-portioning snack foods Diabetes:  appropriate meal and snack schedule; appropriate carb intake and balance, healthy carb choices; role of fiber, protein, fat; physical activity; effects of stress Hypertension: effects of weight loss, veg and fruit intake, role of physical activity   Other intervention notes: Patient has made significant lifestyle changes and is motivated to continue. Established additional goals for change with direction from patient. Patient declined follow up visit at this time but will schedule later if needed.   Nutritional Diagnosis:  [place]-2.1 Inpaired nutrition utilization and [place]-2.2 Altered nutrition-related laboratory As related to diabetes and hypertension.  As evidenced by elevated HbA1C, BP controlled with medication. [place]-3.3 Overweight/obesity As related to excess calories and inadequate physical activity.  As evidenced by patient with current BMI of 43, making lifestyle changes to promote weight loss.   Education Materials given:  [employer] guidelines for Diabetes Plate Planner with food lists, sample meal pattern Sample menus Visit summary with goals/ instructions   Learner/ who was taught:  Patient   Level of understanding: Verbalizes/ demonstrates competency  Demonstrated degree of understanding via:   Teach back Learning barriers: None  Willingness to learn/ readiness for change: Eager, change in progress  Monitoring and Evaluation:  Dietary intake, exercise, BG control, BP control, and body weight      follow up: prn

## 2023-06-02 NOTE — Patient Instructions (Signed)
Plan to eat something every 4-5 hours during the day. Include a protein food + a starch + veg and/or fruit with each meal. Including a lunch will help keep blood sugar steady and prevent excessive snacking.  Keep up regular exercise, great job!

## 2023-06-17 ENCOUNTER — Ambulatory Visit: Payer: BC Managed Care – PPO | Admitting: Nurse Practitioner

## 2023-06-17 ENCOUNTER — Encounter: Payer: Self-pay | Admitting: Nurse Practitioner

## 2023-06-17 VITALS — BP 128/86 | HR 91 | Temp 99.6°F | Ht 74.0 in | Wt 340.4 lb

## 2023-06-17 DIAGNOSIS — I10 Essential (primary) hypertension: Secondary | ICD-10-CM

## 2023-06-17 DIAGNOSIS — E118 Type 2 diabetes mellitus with unspecified complications: Secondary | ICD-10-CM

## 2023-06-17 LAB — MICROALBUMIN / CREATININE URINE RATIO
Creatinine,U: 155.7 mg/dL
Microalb Creat Ratio: 1.3 mg/g (ref 0.0–30.0)
Microalb, Ur: 2 mg/dL — ABNORMAL HIGH (ref 0.0–1.9)

## 2023-06-17 LAB — POCT GLYCOSYLATED HEMOGLOBIN (HGB A1C): Hemoglobin A1C: 5.9 % — AB (ref 4.0–5.6)

## 2023-06-17 NOTE — Assessment & Plan Note (Signed)
Patient had incidental A1c of 6.5%.  Patient was seen by diabetic educator/nutritionist.  Patient is working well and changing his dietary habits.  A1c of 5.9% today.  Continue working lifestyle modifications detailed foot exam done today urine microalbuminuria done today will patient follow-up in 4 months for A1c recheck.

## 2023-06-17 NOTE — Patient Instructions (Signed)
Nice to see you today Continue the amlodipine as prescribed Continue working on the lifestyle changes Follow up with me in 4 months, sooner if you need me

## 2023-06-17 NOTE — Progress Notes (Signed)
Established Patient Office Visit  Subjective   Patient ID: Philip Irwin., male    DOB: 1990-11-03  Age: 32 y.o. MRN: [id number]  Chief Complaint  Patient presents with   Diabetes   Hypertension    Pt checked BP yesterday. Pt states he feels good.       HTN: just taking the amlodipine. States that he is taking his blood pressure at home and is taking it in the evening. States that he is getting 130-140s. Sates this was after he got home from the gym  DM2: states that he went to the nutrition appt and has been doing well. States that he is going to the gym daily everyday at the gym 1-1.5 half and weights. States that he is doing changes on the weekend.  States the am protein shake and furits. Lunch is protien bar and then meal prep.    Review of Systems  Constitutional:  Negative for chills and fever.  Respiratory:  Negative for shortness of breath.   Cardiovascular:  Negative for chest pain.  Neurological:  Negative for dizziness and headaches.      Objective:     BP 128/86   Pulse 91   Temp 99.6 F (37.6 C) (Temporal)   Ht 6\' 2"  (1.88 m)   Wt (!) 340 lb 6.4 oz (154.4 kg)   SpO2 99%   BMI 43.70 kg/m  BP Readings from Last 3 Encounters:  06/17/23 128/86  03/11/23 (!) 142/86  01/28/23 (!) 158/98   Wt Readings from Last 3 Encounters:  06/17/23 (!) 340 lb 6.4 oz (154.4 kg)  06/02/23 (!) 337 lb 8 oz (153.1 kg)  03/11/23 (!) 345 lb (156.5 kg)      Physical Exam Vitals and nursing note reviewed.  Constitutional:      Appearance: Normal appearance.  Cardiovascular:     Rate and Rhythm: Normal rate and regular rhythm.     Heart sounds: Normal heart sounds.  Pulmonary:     Effort: Pulmonary effort is normal.     Breath sounds: Normal breath sounds.  Neurological:     Mental Status: He is alert.    Diabetic Foot Form - Detailed   Diabetic Foot Exam - detailed Diabetic Foot exam was performed with the following findings: Yes 06/17/2023  8:19 AM   Is there swelling or and abnormal foot shape?: No Is there a claw toe deformity?: No Is there elevated skin temparature?: No Pulse Foot Exam completed.: Yes   Right posterior Tibialias: Present Left posterior Tibialias: Present   Right Dorsalis Pedis: Present Left Dorsalis Pedis: Present  Sensory Foot Exam Completed.: Yes Semmes-Weinstein Monofilament Test   Comments: All 10 sites tested sensation intact bilaterally      Results for orders placed or performed in visit on 06/17/23  POCT glycosylated hemoglobin (Hb A1C)  Result Value Ref Range   Hemoglobin A1C 5.9 (A) 4.0 - 5.6 %   HbA1c POC (<> result, manual entry)     HbA1c, POC (prediabetic range)     HbA1c, POC (controlled diabetic range)        The ASCVD Risk score (Arnett DK, et al., 2019) failed to calculate for the following reasons:   The 2019 ASCVD risk score is only valid for ages 36 to 56    Assessment & Plan:   Problem List Items Addressed This Visit       Cardiovascular and Mediastinum   Hypertension    Patient currently maintained zotepine 10 mg daily.  Tolerating medication well.  Blood pressure within the limits today.  He is no longer taking lisinopril he has made lifestyle changes.  Continue checking blood pressure at home continue taking amlodipine as prescribed        Endocrine   Controlled type 2 diabetes mellitus with complication, without long-term current use of insulin (HCC) - Primary    Patient had incidental A1c of 6.5%.  Patient was seen by diabetic educator/nutritionist.  Patient is working well and changing his dietary habits.  A1c of 5.9% today.  Continue working lifestyle modifications detailed foot exam done today urine microalbuminuria done today will patient follow-up in 4 months for A1c recheck.      Relevant Orders   POCT glycosylated hemoglobin (Hb A1C) (Completed)   Microalbumin / creatinine urine ratio    Return in about 4 months (around 10/17/2023) for DM recheck.     Audria Nine, NP

## 2023-06-17 NOTE — Assessment & Plan Note (Signed)
Patient currently maintained zotepine 10 mg daily.  Tolerating medication well.  Blood pressure within the limits today.  He is no longer taking lisinopril he has made lifestyle changes.  Continue checking blood pressure at home continue taking amlodipine as prescribed

## 2023-07-15 ENCOUNTER — Encounter: Payer: Self-pay | Admitting: Nurse Practitioner

## 2023-07-30 ENCOUNTER — Encounter (HOSPITAL_COMMUNITY): Payer: Self-pay

## 2023-07-30 ENCOUNTER — Inpatient Hospital Stay (HOSPITAL_COMMUNITY)
Admission: RE | Admit: 2023-07-30 | Discharge: 2023-07-30 | Discharge status: Home / Self Care | Payer: Self-pay | Source: Ambulatory Visit | Attending: Nurse Practitioner

## 2023-07-30 VITALS — BP 135/84 | HR 93 | Temp 99.9°F | Resp 16

## 2023-07-30 DIAGNOSIS — M79674 Pain in right toe(s): Secondary | ICD-10-CM | POA: Insufficient documentation

## 2023-07-30 LAB — URIC ACID: Uric Acid, Serum: 8 mg/dL (ref 3.7–8.6)

## 2023-07-30 MED ORDER — PREDNISONE 20 MG PO TABS: ORAL_TABLET | ORAL | 0 refills | Status: AC

## 2023-07-30 NOTE — Discharge Instructions (Addendum)
Take the prednisone as prescribed to treat pain and inflammation in your foot.  We are checking blood work today and will contact you if abnormal.  Your symptoms are consistent with gout.  Follow up with PCP if pain does not improve significantly with this treatment.

## 2023-07-30 NOTE — ED Triage Notes (Signed)
Here for right foot pain. 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 No falls or trauma to note.

## 2023-07-30 NOTE — ED Provider Notes (Signed)
[facility]    CSN: [id number] Arrival date & time: 07/30/23  1548      History   Chief Complaint Chief Complaint  Patient presents with   Foot Pain    Intense pain in my right foot. Mostly in the big toe area - Entered by patient    HPI Philip Irwin. is a 32 y.o. male.   Patient presents today with right great toe pain that began int he middle of night.  No injiru known.  No history of gout.     Past Medical History:  Diagnosis Date   Hypertension     Patient Active Problem List   Diagnosis Date Noted   Controlled type 2 diabetes mellitus with complication, without long-term current use of insulin (HCC) 03/11/2023   Prediabetes 01/28/2023   Sore throat 10/29/2022   Need for diphtheria-tetanus-pertussis (Tdap) vaccine 12/04/2021   Hypertension 12/04/2021   Preventative health care 12/04/2021   Morbid obesity (HCC) 12/04/2021   Encounter to establish care with new doctor 07/05/2017    Past Surgical History:  Procedure Laterality Date   ANKLE SURGERY Left    2023   NO PAST SURGERIES         Home Medications    Prior to Admission medications   Medication Sig Start Date End Date Taking? Authorizing Provider  amLODipine (NORVASC) 10 MG tablet TAKE 1 TABLET BY MOUTH EVERY DAY 05/27/23  Yes Eden Emms, NP  Multiple Vitamins-Minerals (MULTIVITAMIN MEN PO) Take by mouth.   Yes [provider]  predniSONE (DELTASONE) 20 MG tablet Take 3 tablets (60mg ) on days 1-2. Take 2 tablets (40mg ) on days 3-4. Take 1 tablet (20mg ) on days 5-6, then stop. 07/30/23  Yes Cathlean Marseilles A, NP  lisinopril (ZESTRIL) 40 MG tablet Take 1 tablet (40 mg total) by mouth daily. Patient not taking: Reported on 06/02/2023 10/29/22   Eden Emms, NP    Family History Family History  Problem Relation Age of Onset   Hypertension Mother        diet and exercise controlled   Hypertension Maternal Grandmother    Diabetes Maternal Grandmother    Prostate  cancer Maternal Grandfather    Stroke Maternal Grandfather    Breast cancer Paternal Grandmother     Social History Social History   Tobacco Use   Smoking status: Never   Smokeless tobacco: Never   Tobacco comments:    None  Vaping Use   Vaping status: Never Used  Substance Use Topics   Alcohol use: Yes    Alcohol/week: 10.0 standard drinks of alcohol    Types: 10 Standard drinks or equivalent per week    Comment: whiskey 4-5 drinks daily on weekends   Drug use: No     Allergies   Patient has no known allergies.   Review of Systems Review of Systems   Physical Exam Triage Vital Signs ED Triage Vitals  Encounter Vitals Group     BP 07/30/23 1612 135/84     Systolic BP Percentile --      Diastolic BP Percentile --      Pulse Rate 07/30/23 1612 93     Resp 07/30/23 1612 16     Temp 07/30/23 1612 99.9 F (37.7 C)     Temp Source 07/30/23 1612 Oral     SpO2 07/30/23 1612 99 %     Weight --      Height --      Head Circumference --  Peak Flow --      Pain Score 07/30/23 1614 6     Pain Loc --      Pain Education --      Exclude from Growth Chart --    No data found.  Updated Vital Signs BP 135/84 (BP Location: Left Arm)   Pulse 93   Temp 99.9 F (37.7 C) (Oral)   Resp 16   SpO2 99%   Visual Acuity Right Eye Distance:   Left Eye Distance:   Bilateral Distance:    Right Eye Near:   Left Eye Near:    Bilateral Near:     Physical Exam   UC Treatments / Results  Labs (all labs ordered are listed, but only abnormal results are displayed) Labs Reviewed  URIC ACID    EKG   Radiology No results found.  Procedures Procedures (including critical care time)  Medications Ordered in UC Medications - No data to display  Initial Impression / Assessment and Plan / UC Course  I have reviewed the triage vital signs and the nursing notes.  Pertinent labs & imaging results that were available during my care of the patient were reviewed by me  and considered in my medical decision making (see chart for details).     *** Final Clinical Impressions(s) / UC Diagnoses   Final diagnoses:  Great toe pain, right     Discharge Instructions      Take the prednisone as prescribed to treat pain and inflammation in your foot.  We are checking blood work today and will contact you if abnormal.  Your symptoms are consistent with gout.  Follow up with PCP if pain does not improve significantly with this treatment.   ED Prescriptions     Medication Sig Dispense Auth. Provider   predniSONE (DELTASONE) 20 MG tablet Take 3 tablets (60mg ) on days 1-2. Take 2 tablets (40mg ) on days 3-4. Take 1 tablet (20mg ) on days 5-6, then stop. 12 tablet Valentino Nose, NP      PDMP not reviewed this encounter.

## 2023-08-15 ENCOUNTER — Ambulatory Visit: Payer: BC Managed Care – PPO | Admitting: Nurse Practitioner

## 2023-08-19 ENCOUNTER — Other Ambulatory Visit: Payer: Self-pay | Admitting: Nurse Practitioner

## 2023-08-19 DIAGNOSIS — I10 Essential (primary) hypertension: Secondary | ICD-10-CM

## 2023-10-13 ENCOUNTER — Ambulatory Visit: Payer: BC Managed Care – PPO | Admitting: Nurse Practitioner

## 2024-06-04 ENCOUNTER — Telehealth: Payer: Self-pay

## 2024-06-04 NOTE — Telephone Encounter (Signed)
 Spoke to pt, sch cpe on 07/18/24

## 2024-06-04 NOTE — Telephone Encounter (Signed)
 Please call patient overdue for follow up with pcp.

## 2024-07-18 ENCOUNTER — Encounter: Admitting: Nurse Practitioner
